# Patient Record
Sex: Female | Born: 1975 | Race: Black or African American | Hispanic: No | Marital: Married | Smoking: Former smoker
Health system: Southern US, Community
[De-identification: ages and names within clinical notes are randomized; demographics above are authoritative.]

## PROBLEM LIST (undated history)

## (undated) DIAGNOSIS — G4730 Sleep apnea, unspecified: Secondary | ICD-10-CM

## (undated) DIAGNOSIS — R42 Dizziness and giddiness: Secondary | ICD-10-CM

## (undated) DIAGNOSIS — O24419 Gestational diabetes mellitus in pregnancy, unspecified control: Secondary | ICD-10-CM

## (undated) HISTORY — PX: CHOLECYSTECTOMY: SHX55

---

## 2004-02-10 ENCOUNTER — Emergency Department: Payer: Self-pay | Admitting: Internal Medicine

## 2004-09-19 ENCOUNTER — Emergency Department: Payer: Self-pay | Admitting: Emergency Medicine

## 2006-03-15 ENCOUNTER — Emergency Department (HOSPITAL_COMMUNITY): Admission: EM | Admit: 2006-03-15 | Discharge: 2006-03-15 | Payer: Self-pay | Admitting: Emergency Medicine

## 2006-08-02 ENCOUNTER — Other Ambulatory Visit: Admission: RE | Admit: 2006-08-02 | Discharge: 2006-08-02 | Payer: Self-pay | Admitting: Obstetrics & Gynecology

## 2008-06-10 ENCOUNTER — Ambulatory Visit: Payer: Self-pay | Admitting: Family Medicine

## 2008-06-10 ENCOUNTER — Inpatient Hospital Stay (HOSPITAL_COMMUNITY): Admission: AD | Admit: 2008-06-10 | Discharge: 2008-06-10 | Payer: Self-pay | Admitting: Obstetrics & Gynecology

## 2010-08-03 LAB — GLUCOSE, CAPILLARY: Glucose-Capillary: 109 mg/dL — ABNORMAL HIGH (ref 70–99)

## 2011-08-27 ENCOUNTER — Encounter (HOSPITAL_COMMUNITY): Payer: Self-pay | Admitting: *Deleted

## 2011-08-27 ENCOUNTER — Emergency Department (HOSPITAL_COMMUNITY)
Admission: EM | Admit: 2011-08-27 | Discharge: 2011-08-27 | Disposition: A | Discharge status: Home / Self Care | Payer: Self-pay | Attending: Emergency Medicine | Admitting: Emergency Medicine

## 2011-08-27 DIAGNOSIS — H00039 Abscess of eyelid unspecified eye, unspecified eyelid: Secondary | ICD-10-CM | POA: Insufficient documentation

## 2011-08-27 DIAGNOSIS — R220 Localized swelling, mass and lump, head: Secondary | ICD-10-CM | POA: Insufficient documentation

## 2011-08-27 MED ORDER — CLINDAMYCIN HCL 150 MG PO CAPS: 450.0000 mg | ORAL_CAPSULE | Freq: Three times a day (TID) | ORAL | Status: AC

## 2011-08-27 MED ORDER — CLINDAMYCIN HCL 150 MG PO CAPS
450.0000 mg | ORAL_CAPSULE | Freq: Once | ORAL | Status: AC
  Administered 2011-08-27: 450 mg via ORAL
  Filled 2011-08-27: qty 1

## 2011-08-27 MED ORDER — HYDROCODONE-ACETAMINOPHEN 5-500 MG PO TABS: 1.0000 | ORAL_TABLET | Freq: Four times a day (QID) | ORAL | Status: AC | PRN

## 2011-08-27 NOTE — ED Notes (Signed)
PA at bedside.

## 2011-08-27 NOTE — ED Provider Notes (Signed)
History     CSN: [id number]  Arrival date & time 08/27/11  1751   First MD Initiated Contact with Patient 08/27/11 1802      Chief Complaint  Patient presents with  . Facial Swelling    Left eye and left cheek    (Consider location/radiation/quality/duration/timing/severity/associated sxs/prior treatment) Patient is a 36 y.o. female presenting with eye pain. The history is provided by the patient.  Eye Pain This is a new problem. The current episode started in the past 7 days. The problem has been gradually worsening. Pertinent negatives include no chills, fever, headaches, nausea, vomiting or weakness. Exacerbated by: touching, blinking. She has tried heat for the symptoms. The treatment provided no relief.  Pt states developed mild swelling to lower eye lid 3 days ago, gradually swelling got worse, now spreading to the face. Tender to touch, Warm, painful. Denies visual changes, denies fever, chills, malaise. No pain with extraocular movement.   History reviewed. No pertinent past medical history.  Past Surgical History  Procedure Date  . Cholecystectomy   . Cesarean section     History reviewed. No pertinent family history.  History  Substance Use Topics  . Smoking status: Current Everyday Smoker    Types: Cigarettes  . Smokeless tobacco: Never Used  . Alcohol Use: Yes     socially    OB History    Grav Para Term Preterm Abortions TAB SAB Ect Mult Living                  Review of Systems  Constitutional: Negative for fever and chills.  Eyes: Positive for pain, discharge and redness. Negative for photophobia and visual disturbance.  Respiratory: Negative.   Cardiovascular: Negative.   Gastrointestinal: Negative for nausea and vomiting.  Musculoskeletal: Negative.   Skin: Positive for color change.  Neurological: Negative for dizziness, weakness and headaches.    Allergies  Codeine  Home Medications   Current Outpatient Rx  Name Route Sig Dispense  Refill  . ADULT MULTIVITAMIN W/MINERALS CH Oral Take 1 tablet by mouth daily.    Marland Kitchen POLYVINYL ALCOHOL 1.4 % OP SOLN Both Eyes Place 1 drop into both eyes as needed. For dry eye relief      BP 149/81  Pulse 98  Temp(Src) 99.4 F (37.4 C) (Oral)  Resp 18  Wt 315 lb (142.883 kg)  SpO2 99%  LMP 08/27/2011  Physical Exam  Nursing note and vitals reviewed. Constitutional: She is oriented to person, place, and time. She appears well-developed and well-nourished. No distress.  HENT:  Head: Normocephalic and atraumatic.  Right Ear: External ear normal.  Left Ear: External ear normal.  Nose: Nose normal.  Mouth/Throat: Oropharynx is clear and moist.  Eyes: Conjunctivae and EOM are normal. Pupils are equal, round, and reactive to light.       Swelling to the left lower eyelid, with redness, extending midway down maxilla. Tender to palpation from left lower eyelid to the left maxilla. No pain with extraocular movement  Neck: Neck supple.  Cardiovascular: Normal rate, regular rhythm and normal heart sounds.   Pulmonary/Chest: Effort normal and breath sounds normal. No respiratory distress. She has no wheezes.  Neurological: She is alert and oriented to person, place, and time.  Skin: Skin is warm and dry.  Psychiatric: She has a normal mood and affect.    ED Course  Procedures (including critical care time)  Exam consistent with periorbital cellulitis. No pain with extraocular movement. Normal VS. Pt thinks this  is allergic reaction to a new cleaning product she used on her pillow 4 days ago, although it is a possibility, due to the amount of tenderness, think this is cellulitis vs allergic inflammation. Will start on antibiotic and benadryl with follow up with eye specialist on Monday. Advised to return if visual changes or if pain with extraocular movement.   1. Periorbital cellulitis, left       MDM         Lottie Mussel, PA 08/28/11 0105

## 2011-08-27 NOTE — ED Notes (Signed)
Pt from home with reports of left eye and cheek swelling that has worsened since Thursday. Pt reports that a cleaning service performed a cleaning demonstration on pt's mattress and pillow on Wednesday and upon awakening Thursday morning left eye began to swell with worsening swelling, redness, numbness and pain since. Pt reports attempting an eye rinse with no relief and denies eye injury.

## 2011-08-27 NOTE — Discharge Instructions (Signed)
Keep warm compresses to the eye. Take clindamycin as prescribed, this medication is cheapest at a [employer]. Take vicodin as prescribed as needed for severe pain, do not drive if taking. Take benadryl, 25mg  every 6 hrs for possible allergic reaction. See an eye doctor on Monday.   Periorbital Cellulitis Periorbital cellulitis is a common infection that can affect the eyelid and the soft tissues that surround the eyeball. The infection may also affect the structures that produce and drain tears. It does not affect the eyeball itself. Natural tissue barriers usually prevent the spread of this infection to the eyeball and other deeper areas of the eye socket.  CAUSES  Bacterial infection.   Long-term (chronic) sinus infections.   An object (foreign body) stuck behind the eye.   An injury that goes through the eyelid tissues.   An injury that causes an infection, such as an insect sting.   Fracture of the bone around the eye.   Infections which have spread from the eyelid or other structures around the eye.   Bite wounds.   Inflammation or infection of the lining membranes of the brain (meningitis).   An infection in the blood (septicemia).   Dental infection (abscess).   Viral infection (this is rare).  SYMPTOMS Symptoms usually come on suddenly.  Pain in the eye.   Red, hot, and swollen eyelids and possibly cheeks. The swelling is sometimes bad enough that the eyelids cannot open. Some infections make the eyelids look purple.   Fever and feeling generally ill.   Pain when touching the area around the eye.  DIAGNOSIS  Periorbital cellulitis can be diagnosed from an eye exam. In severe cases, your caregiver might suggest:  Blood tests.   Imaging tests (such as a CT scan) to examine the sinuses and the area around and behind the eyeball.  TREATMENT If your caregiver feels that you do not have any signs of serious infection, treatment may include:  Antibiotics.     Nasal decongestants to reduce swelling.   Referral to a dentist if it is suspected that the infection was caused by a prior tooth infection.   Examination every day to make sure the problem is improving.  HOME CARE INSTRUCTIONS  Take your antibiotics as directed. Finish them even if you start to feel better.   Some pain is normal with this condition. Take pain medicine as directed by your caregiver. Only take pain medicines approved by your caregiver.   It is important to drink fluids. Drink enough water and fluids to keep your urine clear or pale yellow.   Do not smoke.   Rest and get plenty of sleep.   Mild or moderate fevers generally have no long-term effects and often do not require treatment.   If your caregiver has given you a follow-up appointment, it is very important to keep that appointment. Your caregiver will need to make sure that the infection is getting better. It is important to check that a more serious infection is not developing.  SEEK IMMEDIATE MEDICAL CARE IF:  Your eyelids become more painful, red, warm, or swollen.   You develop double vision or your vision becomes blurred or worsens in any way.   You have trouble moving your eyes.   The eye looks like it is popping out (proptosis).   You develop a severe headache, severe neck pain, or neck stiffness.   You develop repeated vomiting.   You have a fever or persistent symptoms for more  than 72 hours.   You have a fever and your symptoms suddenly get worse.  MAKE SURE YOU:  Understand these instructions.   Will watch your condition.   Will get help right away if you are not doing well or get worse.  Document Released: 05/07/2010 Document Revised: 03/24/2011 Document Reviewed: 05/07/2010 [facility] Patient Information 2012 [place], [place].

## 2011-08-28 NOTE — ED Provider Notes (Signed)
See prior note   Ward Givens, MD 08/28/11 1037

## 2012-01-17 ENCOUNTER — Emergency Department: Payer: Self-pay | Admitting: Unknown Physician Specialty

## 2012-07-28 ENCOUNTER — Emergency Department: Payer: Self-pay | Admitting: Emergency Medicine

## 2012-07-28 LAB — URINALYSIS, COMPLETE
Bilirubin,UR: NEGATIVE
Blood: NEGATIVE
Glucose,UR: NEGATIVE mg/dL (ref 0–75)
Ketone: NEGATIVE
Nitrite: NEGATIVE
Squamous Epithelial: 6
WBC UR: 4 /HPF (ref 0–5)

## 2012-07-28 LAB — WET PREP, GENITAL

## 2012-11-28 ENCOUNTER — Ambulatory Visit: Payer: Self-pay | Admitting: Gastroenterology

## 2013-06-09 ENCOUNTER — Emergency Department: Payer: Self-pay | Admitting: Emergency Medicine

## 2013-09-04 ENCOUNTER — Emergency Department: Payer: Self-pay | Admitting: Emergency Medicine

## 2013-10-18 ENCOUNTER — Emergency Department: Payer: Self-pay | Admitting: Emergency Medicine

## 2014-10-23 ENCOUNTER — Encounter: Payer: Self-pay | Admitting: Urgent Care

## 2014-10-23 ENCOUNTER — Emergency Department
Admission: EM | Admit: 2014-10-23 | Discharge: 2014-10-23 | Discharge status: Against Medical Advice | Payer: Medicaid Other | Attending: Emergency Medicine | Admitting: Emergency Medicine

## 2014-10-23 ENCOUNTER — Emergency Department: Payer: Medicaid Other

## 2014-10-23 DIAGNOSIS — S6992XA Unspecified injury of left wrist, hand and finger(s), initial encounter: Secondary | ICD-10-CM | POA: Insufficient documentation

## 2014-10-23 DIAGNOSIS — Z87891 Personal history of nicotine dependence: Secondary | ICD-10-CM | POA: Insufficient documentation

## 2014-10-23 DIAGNOSIS — Y998 Other external cause status: Secondary | ICD-10-CM | POA: Diagnosis not present

## 2014-10-23 DIAGNOSIS — Y9289 Other specified places as the place of occurrence of the external cause: Secondary | ICD-10-CM | POA: Insufficient documentation

## 2014-10-23 DIAGNOSIS — Y9389 Activity, other specified: Secondary | ICD-10-CM | POA: Insufficient documentation

## 2014-10-23 DIAGNOSIS — W1789XA Other fall from one level to another, initial encounter: Secondary | ICD-10-CM | POA: Insufficient documentation

## 2014-10-23 NOTE — ED Notes (Signed)
Ice pack applied.

## 2014-10-23 NOTE — ED Notes (Signed)
Called pt from the lobby for room assignment, no response  

## 2014-10-23 NOTE — ED Notes (Signed)
Patient presents with c/o LEFT wrist pain s/p falling off porch. (+) swelling with slight deformity noted. (+) PMS noted with cap refill WNL.

## 2014-12-25 ENCOUNTER — Emergency Department
Admission: EM | Admit: 2014-12-25 | Discharge: 2014-12-25 | Discharge status: Against Medical Advice | Payer: Medicaid Other | Attending: Emergency Medicine | Admitting: Emergency Medicine

## 2014-12-25 DIAGNOSIS — R109 Unspecified abdominal pain: Secondary | ICD-10-CM | POA: Diagnosis not present

## 2014-12-25 DIAGNOSIS — R05 Cough: Secondary | ICD-10-CM | POA: Diagnosis not present

## 2014-12-25 DIAGNOSIS — J029 Acute pharyngitis, unspecified: Secondary | ICD-10-CM | POA: Insufficient documentation

## 2014-12-25 DIAGNOSIS — R197 Diarrhea, unspecified: Secondary | ICD-10-CM | POA: Diagnosis not present

## 2014-12-25 DIAGNOSIS — R112 Nausea with vomiting, unspecified: Secondary | ICD-10-CM | POA: Insufficient documentation

## 2014-12-25 DIAGNOSIS — Z87891 Personal history of nicotine dependence: Secondary | ICD-10-CM | POA: Insufficient documentation

## 2014-12-25 LAB — CBC
HEMATOCRIT: 38.6 % (ref 35.0–47.0)
HEMOGLOBIN: 12.5 g/dL (ref 12.0–16.0)
MCH: 26.3 pg (ref 26.0–34.0)
MCHC: 32.4 g/dL (ref 32.0–36.0)
MCV: 81.1 fL (ref 80.0–100.0)
Platelets: 232 10*3/uL (ref 150–440)
RBC: 4.75 MIL/uL (ref 3.80–5.20)
RDW: 15.3 % — ABNORMAL HIGH (ref 11.5–14.5)
WBC: 8.4 10*3/uL (ref 3.6–11.0)

## 2014-12-25 LAB — COMPREHENSIVE METABOLIC PANEL
ALBUMIN: 4.1 g/dL (ref 3.5–5.0)
ALK PHOS: 68 U/L (ref 38–126)
ALT: 13 U/L — ABNORMAL LOW (ref 14–54)
ANION GAP: 8 (ref 5–15)
AST: 19 U/L (ref 15–41)
BUN: 9 mg/dL (ref 6–20)
CALCIUM: 9.2 mg/dL (ref 8.9–10.3)
CHLORIDE: 102 mmol/L (ref 101–111)
CO2: 27 mmol/L (ref 22–32)
Creatinine, Ser: 0.88 mg/dL (ref 0.44–1.00)
GFR calc Af Amer: >60 mL/min (ref >60)
GFR calc non Af Amer: >60 mL/min (ref >60)
GLUCOSE: 90 mg/dL (ref 65–99)
Potassium: 3.6 mmol/L (ref 3.5–5.1)
SODIUM: 137 mmol/L (ref 135–145)
Total Bilirubin: 0.4 mg/dL (ref 0.3–1.2)
Total Protein: 7.9 g/dL (ref 6.5–8.1)

## 2014-12-25 LAB — LIPASE, BLOOD: LIPASE: 14 U/L — AB (ref 22–51)

## 2014-12-25 NOTE — ED Notes (Signed)
38 yof presents to the ED with c/o abdominal pain, nausea, vomiting, diarrhea. Patient states she has vomited 4x and has had 8 diarrheal episodes in the past 24 hours.  Patient denies chest pain, dysuria. Patient does c/o cough and sore throat. Patient denies direct contact with anyone who has been sick. Does state she has been visiting her daughter who is in 1st grade frequently for lunch.

## 2014-12-25 NOTE — ED Notes (Signed)
Pt has been called in the lobby multiple times without response

## 2014-12-26 ENCOUNTER — Encounter: Payer: Self-pay | Admitting: Emergency Medicine

## 2014-12-26 ENCOUNTER — Emergency Department
Admission: EM | Admit: 2014-12-26 | Discharge: 2014-12-26 | Disposition: A | Discharge status: Home / Self Care | Payer: Medicaid Other | Attending: Emergency Medicine | Admitting: Emergency Medicine

## 2014-12-26 DIAGNOSIS — A084 Viral intestinal infection, unspecified: Secondary | ICD-10-CM | POA: Insufficient documentation

## 2014-12-26 DIAGNOSIS — Z79899 Other long term (current) drug therapy: Secondary | ICD-10-CM | POA: Insufficient documentation

## 2014-12-26 DIAGNOSIS — Z87891 Personal history of nicotine dependence: Secondary | ICD-10-CM | POA: Diagnosis not present

## 2014-12-26 DIAGNOSIS — R197 Diarrhea, unspecified: Secondary | ICD-10-CM | POA: Diagnosis present

## 2014-12-26 MED ORDER — ONDANSETRON 4 MG PO TBDP
4.0000 mg | ORAL_TABLET | Freq: Once | ORAL | Status: AC
  Administered 2014-12-26: 4 mg via ORAL
  Filled 2014-12-26: qty 1

## 2014-12-26 MED ORDER — ONDANSETRON 4 MG PO TBDP: 4.0000 mg | ORAL_TABLET | Freq: Three times a day (TID) | ORAL | Status: AC | PRN

## 2014-12-26 NOTE — ED Notes (Signed)
Pt here for n/v/d and body aches x 3 days.

## 2014-12-26 NOTE — ED Provider Notes (Signed)
[address] [facility] Emergency Department Provider Note  ____________________________________________  Time seen: Approximately 9:31 AM  I have reviewed the triage vital signs and the nursing notes.   HISTORY  Chief Complaint Diarrhea   HPI Olivia Lang is a 39 y.o. female is here with complaint of nausea, vomiting, diarrhea and body aches for 3 days. She states in the last 24 hours she has had 4 episodes of vomiting and 8 episodes of diarrhea. She attributes all this to going to her daughter's first grade class and having lunch with her frequently. She denies any sore throat or coughing. She is unaware of any fever. She denies any urinary symptoms or upper respiratory symptoms. She states she has not eaten today. She was able to drive to the hospital today. She states that her pain due to diarrhea is an 8/10.  History reviewed. No pertinent past medical history.  There are no active problems to display for this patient.   Past Surgical History  Procedure Laterality Date  . Cholecystectomy    . Cesarean section      Current Outpatient Rx  Name  Route  Sig  Dispense  Refill  . Multiple Vitamin (MULITIVITAMIN WITH MINERALS) TABS   Oral   Take 1 tablet by mouth daily.         . ondansetron (ZOFRAN ODT) 4 MG disintegrating tablet   Oral   Take 1 tablet (4 mg total) by mouth every 8 (eight) hours as needed for nausea or vomiting.   20 tablet   0   . polyvinyl alcohol (LIQUIFILM TEARS) 1.4 % ophthalmic solution   Both Eyes   Place 1 drop into both eyes as needed. For dry eye relief           Allergies Codeine  History reviewed. No pertinent family history.  Social History Social History  Substance Use Topics  . Smoking status: Former Smoker    Types: Cigarettes  . Smokeless tobacco: Never Used  . Alcohol Use: No     Comment: socially    Review of Systems Constitutional: No fever/chills Eyes: No visual changes. ENT: No sore  throat. Cardiovascular: Denies chest pain. Respiratory: Denies shortness of breath. Gastrointestinal: No abdominal pain.  Positive nausea, positive vomiting.  Positive diarrhea.  No constipation. Genitourinary: Negative for dysuria. Musculoskeletal: Negative for back pain. Skin: Negative for rash. Neurological: Negative for headaches, focal weakness or numbness.  10-point ROS otherwise negative.  ____________________________________________   PHYSICAL EXAM:  VITAL SIGNS: ED Triage Vitals  Enc Vitals Group     BP 12/26/14 0925 147/93 mmHg     Pulse Rate 12/26/14 0925 96     Resp 12/26/14 0925 18     Temp 12/26/14 0925 99 F (37.2 C)     Temp Source 12/26/14 0925 Oral     SpO2 12/26/14 0925 96 %     Weight 12/26/14 0929 345 lb (156.491 kg)     Height 12/26/14 0929 5' (1.524 m)     Head Cir --      Peak Flow --      Pain Score 12/26/14 0925 9     Pain Loc --      Pain Edu? --      Excl. in GC? --     Constitutional: Alert and oriented. Well appearing and in no acute distress. Morbidly obese. Patient is lying on her stomach, sleeping at present. Eyes: Conjunctivae are normal. PERRL. EOMI. Head: Atraumatic. Nose: No congestion/rhinnorhea. Mouth/Throat: Mucous membranes  are moist.  Oropharynx non-erythematous. Neck: No stridor.  Supple Hematological/Lymphatic/Immunilogical: No cervical lymphadenopathy. Cardiovascular: Normal rate, regular rhythm. Grossly normal heart sounds.  Good peripheral circulation. Respiratory: Normal respiratory effort.  No retractions. Lungs CTAB. Gastrointestinal: Soft and nontender. No distention. No abdominal bruits. No CVA tenderness. Bowel sounds 4 quadrants within normal limits. Musculoskeletal: No lower extremity tenderness nor edema.  No joint effusions. Neurologic:  Normal speech and language. No gross focal neurologic deficits are appreciated. No gait instability. Skin:  Skin is warm, dry and intact. No rash noted. Psychiatric: Mood and  affect are normal. Speech and behavior are normal.  ____________________________________________   LABS (all labs ordered are listed, but only abnormal results are displayed)  Labs Reviewed - No data to display   PROCEDURES  Procedure(s) performed: None  Critical Care performed: No  ____________________________________________   INITIAL IMPRESSION / ASSESSMENT AND PLAN / ED COURSE  Pertinent labs & imaging results that were available during my care of the patient were reviewed by me and considered in my medical decision making (see chart for details).  Patient was given Zofran while in the emergency room and was able to tolerate by mouth fluids without any difficulty. Patient was asleep prior to discharge. Patient states she needs a work note for today. She is given a prescription for Zofran and told to remain on clear fluids. She may take Tylenol as needed for muscle aches or headache. She is follow-up with her family doctor if no improvement in 2-3 days. ____________________________________________   FINAL CLINICAL IMPRESSION(S) / ED DIAGNOSES  Final diagnoses:  Viral gastroenteritis      Tommi Rumps, PA-C 12/26/14 1159  Sharyn Creamer, MD 12/26/14 1544

## 2015-04-17 ENCOUNTER — Emergency Department
Admission: EM | Admit: 2015-04-17 | Discharge: 2015-04-17 | Disposition: A | Discharge status: Home / Self Care | Payer: Medicaid Other | Attending: Emergency Medicine | Admitting: Emergency Medicine

## 2015-04-17 ENCOUNTER — Encounter: Payer: Self-pay | Admitting: Emergency Medicine

## 2015-04-17 DIAGNOSIS — Z79899 Other long term (current) drug therapy: Secondary | ICD-10-CM | POA: Diagnosis not present

## 2015-04-17 DIAGNOSIS — Z3202 Encounter for pregnancy test, result negative: Secondary | ICD-10-CM | POA: Insufficient documentation

## 2015-04-17 DIAGNOSIS — Z87891 Personal history of nicotine dependence: Secondary | ICD-10-CM | POA: Diagnosis not present

## 2015-04-17 DIAGNOSIS — N898 Other specified noninflammatory disorders of vagina: Secondary | ICD-10-CM | POA: Diagnosis present

## 2015-04-17 HISTORY — DX: Gestational diabetes mellitus in pregnancy, unspecified control: O24.419

## 2015-04-17 LAB — URINALYSIS COMPLETE WITH MICROSCOPIC (ARMC ONLY)
BILIRUBIN URINE: NEGATIVE
GLUCOSE, UA: NEGATIVE mg/dL
HGB URINE DIPSTICK: NEGATIVE
Ketones, ur: NEGATIVE mg/dL
LEUKOCYTES UA: NEGATIVE
Nitrite: NEGATIVE
PH: 6 (ref 5.0–8.0)
Protein, ur: NEGATIVE mg/dL
RBC / HPF: NONE SEEN RBC/hpf (ref 0–5)
SPECIFIC GRAVITY, URINE: 1.023 (ref 1.005–1.030)

## 2015-04-17 LAB — HCG, QUANTITATIVE, PREGNANCY

## 2015-04-17 LAB — GLUCOSE, CAPILLARY
Glucose-Capillary: 79 mg/dL (ref 65–99)
Glucose-Capillary: 89 mg/dL (ref 65–99)

## 2015-04-17 LAB — POCT PREGNANCY, URINE: Preg Test, Ur: NEGATIVE

## 2015-04-17 NOTE — ED Notes (Signed)
States took a pregnancy test, which was positive.  Patient called PCP who told her to come to ED to confirm pregnancy and to have blood sugar checked because last pregnancy patient had gestational diabetes.  Patient c/o some vaginal itching and moistness.  LMP:  03/24/2015.  G:9  P:1 .  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

## 2015-04-17 NOTE — Discharge Instructions (Signed)
Pregnancy Test Information °WHAT IS A PREGNANCY TEST? °A pregnancy test is used to detect the presence of human chorionic gonadotropin (hCG) in a sample of your urine or blood. hCG is a hormone produced by the cells of the placenta. The placenta is the organ that forms to nourish and support a developing baby. °This test requires a sample of either blood or urine. A pregnancy test determines whether you are pregnant or not. °HOW ARE PREGNANCY TESTS DONE? °Pregnancy tests are done using a home pregnancy test or having a blood or urine test done at your health care provider's office.  °Home pregnancy tests require a urine sample. °· Most kits use a plastic testing device with a strip of paper that indicates whether there is hCG in your urine. °· Follow the test instructions very carefully. °· After you urinate on the test stick, markings will appear to let you know whether you are pregnant. °· For best results, use your first urine of the morning. That is when the concentration of hCG is highest. °Having a blood test to check for pregnancy requires a sample of blood drawn from a vein in your hand or arm. Your health care provider will send your sample to a lab for testing. Results of a pregnancy test will be positive or negative. °IS ONE TYPE OF PREGNANCY TEST BETTER THAN ANOTHER? °In some cases, a blood test will return a positive result even if a urine test was negative because blood tests are more sensitive. This means blood tests can detect hCG earlier than home pregnancy tests.  °HOW ACCURATE ARE HOME PREGNANCY TESTS?  °Both types of pregnancy tests are very accurate. °· A blood test is about 98% accurate. °· When you are far enough along in your pregnancy and when used correctly, home pregnancy tests are equally accurate. °CAN ANYTHING INTERFERE WITH HOME PREGNANCY TEST RESULTS?  °It is possible for certain conditions to cause an inaccurate test result (false positive or false negative). °· A false positive is a  positive test result when you are not pregnant. This can happen if you: °¨ Are taking certain medicines, including anticonvulsants or tranquilizers. °¨ Have certain proteins in your blood. °· A false negative is a negative test result when you are pregnant. This can happen if you: °¨ Took the test before there was enough hCG to detect. A pregnancy test will not be positive in most women until 3-4 weeks after conception. °¨ Drank a lot of liquid before the test. Diluted urine samples can sometimes give an inaccurate result. °¨ Take certain medicines, such as water pills (diuretics) or some antihistamines. °WHAT SHOULD I DO IF I HAVE A POSITIVE PREGNANCY TEST? °If you have a positive pregnancy test, schedule an appointment with your health care provider. You might need additional testing to confirm the pregnancy. In the meantime, begin taking a prenatal vitamin, stop smoking, stop drinking alcohol, and do not use street drugs. °Talk to your health care provider about how to take care of yourself during your pregnancy. Ask about what to expect from the care you will need throughout pregnancy (prenatal care). °  °This information is not intended to replace advice given to you by your health care provider. Make sure you discuss any questions you have with your health care provider. °  °Document Released: 04/07/2003 Document Revised: 04/25/2014 Document Reviewed: 07/30/2013 °Elsevier Interactive Patient Education ©2016 [employer] ° °

## 2015-04-17 NOTE — ED Provider Notes (Signed)
[facility] Emergency Department Provider Note ____________________________________________  Time seen: Approximately 3:54 PM  I have reviewed the triage vital signs and the nursing notes.   HISTORY  Chief Complaint Vaginal Itching   HPI Olivia Lang is a 39 y.o. female who presents to the emergency department for confirmation of pregnancy. Home pregnancy test was positive. She denies other complaints and says that she has an appointment with her doctor on Friday and will address any other concerns at that time. She wants to know if she is pregnant and what her blood sugar is.    Past Medical History  Diagnosis Date  . Gestational diabetes     There are no active problems to display for this patient.   Past Surgical History  Procedure Laterality Date  . Cholecystectomy    . Cesarean section      Current Outpatient Rx  Name  Route  Sig  Dispense  Refill  . Multiple Vitamin (MULITIVITAMIN WITH MINERALS) TABS   Oral   Take 1 tablet by mouth daily.         . ondansetron (ZOFRAN ODT) 4 MG disintegrating tablet   Oral   Take 1 tablet (4 mg total) by mouth every 8 (eight) hours as needed for nausea or vomiting.   20 tablet   0   . polyvinyl alcohol (LIQUIFILM TEARS) 1.4 % ophthalmic solution   Both Eyes   Place 1 drop into both eyes as needed. For dry eye relief           Allergies Codeine  No family history on file.  Social History Social History  Substance Use Topics  . Smoking status: Former Smoker    Types: Cigarettes  . Smokeless tobacco: Never Used  . Alcohol Use: No     Comment: socially    Review of Systems Constitutional: No fever/chills Cardiovascular: Denies chest pain. Respiratory: Denies shortness of breath or cough. Gastrointestinal: Abdominal pain no., nausea no, vomitingno. Genitourinary: Dysuria no, vaginal discharge no.. Musculoskeletal: Negative for back pain. Skin: Negative for rash. Neurological:  Negative for headaches, focal weakness or numbness.  10-point ROS otherwise negative.  ____________________________________________   PHYSICAL EXAM:  VITAL SIGNS: ED Triage Vitals  Enc Vitals Group     BP 04/17/15 1507 131/75 mmHg     Pulse Rate 04/17/15 1507 81     Resp 04/17/15 1507 16     Temp 04/17/15 1507 97.7 F (36.5 C)     Temp Source 04/17/15 1507 Oral     SpO2 04/17/15 1507 99 %     Weight 04/17/15 1507 352 lb (159.666 kg)     Height 04/17/15 1507 5\' 4"  (1.626 m)     Head Cir --      Peak Flow --      Pain Score 04/17/15 1509 0     Pain Loc --      Pain Edu? --      Excl. in GC? --    Constitutional: Alert and oriented. Well appearing and in no acute distress. Eyes: Conjunctivae are normal. PERRL. EOMI. Head: Atraumatic. Nose: No congestion/rhinnorhea. Mouth/Throat: Mucous membranes are moist. Neck: No stridor. Cardiovascular: Good peripheral circulation. Respiratory: Normal respiratory effort.  No retractions. Gastrointestinal: Soft and nontender. No distention. No abdominal bruits. Genitourinary: Pelvic exam: Deferred Musculoskeletal: No extremity tenderness nor edema.  Neurologic:  Normal speech and language. No gross focal neurologic deficits are appreciated. Speech is normal. No gait instability. Skin:  Skin is warm, dry and  intact. No rash noted. Psychiatric: Mood and affect are normal. Speech and behavior are normal.  ____________________________________________   LABS (all labs ordered are listed, but only abnormal results are displayed)  Labs Reviewed  URINALYSIS COMPLETEWITH MICROSCOPIC ([facility] ONLY) - Abnormal; Notable for the following:    Color, Urine YELLOW (*)    APPearance HAZY (*)    Bacteria, UA RARE (*)    Squamous Epithelial / LPF 6-30 (*)    All other components within normal limits  GLUCOSE, CAPILLARY  GLUCOSE, CAPILLARY  HCG, QUANTITATIVE, PREGNANCY  POC URINE PREG, ED  CBG MONITORING, ED  POCT PREGNANCY, URINE    ____________________________________________  RADIOLOGY  Not indicated. ____________________________________________   PROCEDURES  Procedure(s) performed: None  ____________________________________________   INITIAL IMPRESSION / ASSESSMENT AND PLAN / ED COURSE  Pertinent labs & imaging results that were available during my care of the patient were reviewed by me and considered in my medical decision making (see chart for details).  Patient was advised that she is not pregnant. She was advised to follow up with her PCP on Friday as scheduled and return to the ER for symptoms of concern if unable to see PCP. ____________________________________________   FINAL CLINICAL IMPRESSION(S) / ED DIAGNOSES  Final diagnoses:  Pregnancy test negative       Chinita Pester, FNP 04/17/15 1610  Phineas Semen, MD 04/17/15 2211

## 2015-06-26 ENCOUNTER — Emergency Department
Admission: EM | Admit: 2015-06-26 | Discharge: 2015-06-26 | Disposition: A | Discharge status: Home / Self Care | Payer: Medicaid Other | Attending: Emergency Medicine | Admitting: Emergency Medicine

## 2015-06-26 ENCOUNTER — Encounter: Payer: Self-pay | Admitting: Emergency Medicine

## 2015-06-26 DIAGNOSIS — Z87891 Personal history of nicotine dependence: Secondary | ICD-10-CM | POA: Insufficient documentation

## 2015-06-26 DIAGNOSIS — R42 Dizziness and giddiness: Secondary | ICD-10-CM | POA: Diagnosis not present

## 2015-06-26 LAB — COMPREHENSIVE METABOLIC PANEL
ALBUMIN: 4.3 g/dL (ref 3.5–5.0)
ALK PHOS: 55 U/L (ref 38–126)
ALT: 14 U/L (ref 14–54)
ANION GAP: 9 (ref 5–15)
AST: 17 U/L (ref 15–41)
BILIRUBIN TOTAL: 0.5 mg/dL (ref 0.3–1.2)
BUN: 9 mg/dL (ref 6–20)
CALCIUM: 9.3 mg/dL (ref 8.9–10.3)
CO2: 23 mmol/L (ref 22–32)
Chloride: 104 mmol/L (ref 101–111)
Creatinine, Ser: 0.76 mg/dL (ref 0.44–1.00)
Glucose, Bld: 87 mg/dL (ref 65–99)
POTASSIUM: 4.1 mmol/L (ref 3.5–5.1)
Sodium: 136 mmol/L (ref 135–145)
TOTAL PROTEIN: 7.7 g/dL (ref 6.5–8.1)

## 2015-06-26 LAB — URINALYSIS COMPLETE WITH MICROSCOPIC (ARMC ONLY)
BACTERIA UA: NONE SEEN
Bilirubin Urine: NEGATIVE
Glucose, UA: NEGATIVE mg/dL
HGB URINE DIPSTICK: NEGATIVE
LEUKOCYTES UA: NEGATIVE
NITRITE: NEGATIVE
PROTEIN: NEGATIVE mg/dL
RBC / HPF: NONE SEEN RBC/hpf (ref 0–5)
SPECIFIC GRAVITY, URINE: 1.019 (ref 1.005–1.030)
pH: 6 (ref 5.0–8.0)

## 2015-06-26 LAB — CBC
HEMATOCRIT: 37.4 % (ref 35.0–47.0)
HEMOGLOBIN: 12.6 g/dL (ref 12.0–16.0)
MCH: 26.9 pg (ref 26.0–34.0)
MCHC: 33.7 g/dL (ref 32.0–36.0)
MCV: 79.6 fL — ABNORMAL LOW (ref 80.0–100.0)
Platelets: 232 10*3/uL (ref 150–440)
RBC: 4.7 MIL/uL (ref 3.80–5.20)
RDW: 15.1 % — ABNORMAL HIGH (ref 11.5–14.5)
WBC: 10.7 10*3/uL (ref 3.6–11.0)

## 2015-06-26 LAB — POCT PREGNANCY, URINE: PREG TEST UR: NEGATIVE

## 2015-06-26 LAB — LIPASE, BLOOD: Lipase: <10 U/L — ABNORMAL LOW (ref 11–51)

## 2015-06-26 MED ORDER — MECLIZINE HCL 25 MG PO TABS: 25.0000 mg | ORAL_TABLET | Freq: Three times a day (TID) | ORAL | Status: DC | PRN

## 2015-06-26 MED ORDER — ONDANSETRON HCL 4 MG PO TABS: 4.0000 mg | ORAL_TABLET | Freq: Three times a day (TID) | ORAL | Status: DC | PRN

## 2015-06-26 NOTE — ED Notes (Addendum)
Patient reports dizziness x2 days. Reports vomiting today with one episode of diarrhea (x4 in last 3 hours). Denies headache. Patient in no acute distress, ambulatory to triage without difficulty. Patient states she has been taking Phentermine 15mg .

## 2015-06-26 NOTE — ED Notes (Signed)
Patient unable to give urine sample at this time. Sample cup given to patient with instructions.

## 2015-06-26 NOTE — ED Notes (Signed)
Patient updated on wait time. She understands and is just feeling poorly. Explained to her we would be able to get her back to the treatment area within about 10 mins providing nothing major went on.

## 2015-06-26 NOTE — ED Provider Notes (Signed)
[facility] Emergency Department Provider Note    ____________________________________________  Time seen: ~2215  I have reviewed the triage vital signs and the nursing notes.   HISTORY  Chief Complaint Dizziness   History limited by: Not Limited   HPI Olivia Lang is a 40 y.o. female who presents to the emergency department today because of dizziness. The patient states this is been going on for 2 days. It is intermittent. It is worse when she stands up. She describes it as a sense of feeling lightheaded. It has been accompanied by some nausea. The patient states that she did have some vomiting and diarrhea today. She denies any blood in either. The patient denies any headache. No chest pain or palpitations.    Past Medical History  Diagnosis Date  . Gestational diabetes     There are no active problems to display for this patient.   Past Surgical History  Procedure Laterality Date  . Cholecystectomy    . Cesarean section      Current Outpatient Rx  Name  Route  Sig  Dispense  Refill  . meclizine (ANTIVERT) 25 MG tablet   Oral   Take 1 tablet (25 mg total) by mouth 3 (three) times daily as needed for dizziness.   30 tablet   0   . Multiple Vitamin (MULITIVITAMIN WITH MINERALS) TABS   Oral   Take 1 tablet by mouth daily.         . ondansetron (ZOFRAN ODT) 4 MG disintegrating tablet   Oral   Take 1 tablet (4 mg total) by mouth every 8 (eight) hours as needed for nausea or vomiting.   20 tablet   0   . ondansetron (ZOFRAN) 4 MG tablet   Oral   Take 1 tablet (4 mg total) by mouth every 8 (eight) hours as needed.   20 tablet   0   . polyvinyl alcohol (LIQUIFILM TEARS) 1.4 % ophthalmic solution   Both Eyes   Place 1 drop into both eyes as needed. For dry eye relief           Allergies Codeine  No family history on file.  Social History Social History  Substance Use Topics  . Smoking status: Former Smoker     Types: Cigarettes  . Smokeless tobacco: Never Used  . Alcohol Use: No     Comment: socially    Review of Systems  Constitutional: Negative for fever. Cardiovascular: Negative for chest pain. Respiratory: Negative for shortness of breath. Gastrointestinal: Negative for abdominal pain, vomiting and diarrhea. Neurological: Negative for headaches, focal weakness or numbness.  10-point ROS otherwise negative.  ____________________________________________   PHYSICAL EXAM:  VITAL SIGNS: ED Triage Vitals  Enc Vitals Group     BP 06/26/15 1850 121/75 mmHg     Pulse Rate 06/26/15 1850 88     Resp 06/26/15 1850 20     Temp 06/26/15 1850 98 F (36.7 C)     Temp Source 06/26/15 1850 Oral     SpO2 06/26/15 1850 100 %     Weight 06/26/15 1850 350 lb (158.759 kg)     Height 06/26/15 1850  (1.651 m)     Head Cir --      Peak Flow --      Pain Score 06/26/15 1851 0   Constitutional: Alert and oriented. Well appearing and in no distress. Eyes: Conjunctivae are normal. PERRL. Normal extraocular movements.No nystagmus ENT   Head: Normocephalic and atraumatic.  Nose: No congestion/rhinnorhea.   Mouth/Throat: Mucous membranes are moist.   Neck: No stridor. Hematological/Lymphatic/Immunilogical: No cervical lymphadenopathy. Cardiovascular: Normal rate, regular rhythm.  No murmurs, rubs, or gallops. Respiratory: Normal respiratory effort without tachypnea nor retractions. Breath sounds are clear and equal bilaterally. No wheezes/rales/rhonchi. Gastrointestinal: Soft and nontender. No distention. There is no CVA tenderness. Genitourinary: Deferred Musculoskeletal: Normal range of motion in all extremities. No joint effusions.  No lower extremity tenderness nor edema. Neurologic:  Normal speech and language. Cranial nerves intact. Face symmetric. No nystagmus . Strength 5 out of 5 in upper and lower extremities. Sensation grossly intact. No gross focal neurologic deficits are  appreciated.  Skin:  Skin is warm, dry and intact. No rash noted. Psychiatric: Mood and affect are normal. Speech and behavior are normal. Patient exhibits appropriate insight and judgment.  ____________________________________________    LABS (pertinent positives/negatives)  Labs Reviewed  LIPASE, BLOOD - Abnormal; Notable for the following:    Lipase <10 (*)    All other components within normal limits  CBC - Abnormal; Notable for the following:    MCV 79.6 (*)    RDW 15.1 (*)    All other components within normal limits  URINALYSIS COMPLETEWITH MICROSCOPIC ([facility] ONLY) - Abnormal; Notable for the following:    Color, Urine YELLOW (*)    APPearance CLEAR (*)    Ketones, ur 1+ (*)    Squamous Epithelial / LPF 0-5 (*)    All other components within normal limits  COMPREHENSIVE METABOLIC PANEL  POC URINE PREG, ED  POCT PREGNANCY, URINE     ____________________________________________   EKG  None  ____________________________________________    RADIOLOGY  None   ____________________________________________   PROCEDURES  Procedure(s) performed: None  Critical Care performed: No  ____________________________________________   INITIAL IMPRESSION / ASSESSMENT AND PLAN / ED COURSE  Pertinent labs & imaging results that were available during my care of the patient were reviewed by me and considered in my medical decision making (see chart for details).  Patient presents to the emergency department today because of concerns for dizziness, nausea and vomiting. On exam patient appears well. No focal neuro deficits. This point I do not think patient suffered from a central lesion given the intermittent nature of the episodes. Think more likely patient suffering some vertigo versus BPPV. Will plan on giving patient meclizine and Zofran.   ____________________________________________   FINAL CLINICAL IMPRESSION(S) / ED DIAGNOSES  Final diagnoses:  Dizziness      Phineas SemenGraydon Tsuneo Faison, MD 06/26/15 [phone]

## 2015-06-26 NOTE — Discharge Instructions (Signed)
Please seek medical attention for any high fevers, chest pain, shortness of breath, change in behavior, persistent vomiting, bloody stool or any other new or concerning symptoms. ° ° °Dizziness °Dizziness is a common problem. It makes you feel unsteady or lightheaded. You may feel like you are about to pass out (faint). Dizziness can lead to injury if you stumble or fall. Anyone can get dizzy, but dizziness is more common in older adults. This condition can be caused by a number of things, including: °· Medicines. °· Dehydration. °· Illness. °HOME CARE °Following these instructions may help with your condition: °Eating and Drinking °· Drink enough fluid to keep your pee (urine) clear or pale yellow. This helps to keep you from getting dehydrated. Try to drink more clear fluids, such as water. °· Do not drink alcohol. °· Limit how much caffeine you drink or eat if told by your doctor. °· Limit how much salt you drink or eat if told by your doctor. °Activity °· Avoid making quick movements. °¨ When you stand up from sitting in a chair, steady yourself until you feel okay. °¨ In the morning, first sit up on the side of the bed. When you feel okay, stand slowly while you hold onto something. Do this until you know that your balance is fine. °· Move your legs often if you need to stand in one place for a long time. Tighten and relax your muscles in your legs while you are standing. °· Do not drive or use heavy machinery if you feel dizzy. °· Avoid bending down if you feel dizzy. Place items in your home so that they are easy for you to reach without leaning over. °Lifestyle °· Do not use any tobacco products, including cigarettes, chewing tobacco, or electronic cigarettes. If you need help quitting, ask your doctor. °· Try to lower your stress level, such as with yoga or meditation. Talk with your doctor if you need help. °General Instructions °· Watch your dizziness for any changes. °· Take medicines only as told by  your doctor. Talk with your doctor if you think that your dizziness is caused by a medicine that you are taking. °· Tell a friend or a family member that you are feeling dizzy. If he or she notices any changes in your behavior, have this person call your doctor. °· Keep all follow-up visits as told by your doctor. This is important. °GET HELP IF: °· Your dizziness does not go away. °· Your dizziness or light-headedness gets worse. °· You feel sick to your stomach (nauseous). °· You have trouble hearing. °· You have new symptoms. °· You are unsteady on your feet or you feel like the room is spinning. °GET HELP RIGHT AWAY IF: °· You throw up (vomit) or have diarrhea and are unable to eat or drink anything. °· You have trouble: °¨ Talking. °¨ Walking. °¨ Swallowing. °¨ Using your arms, hands, or legs. °· You feel generally weak. °· You are not thinking clearly or you have trouble forming sentences. It may take a friend or family member to notice this. °· You have: °¨ Chest pain. °¨ Pain in your belly (abdomen). °¨ Shortness of breath. °¨ Sweating. °· Your vision changes. °· You are bleeding. °· You have a headache. °· You have neck pain or a stiff neck. °· You have a fever. °  °This information is not intended to replace advice given to you by your health care provider. Make sure you discuss any questions you   have with your health care provider. °  °Document Released: 03/24/2011 Document Revised: 08/19/2014 Document Reviewed: 03/31/2014 °Elsevier Interactive Patient Education ©2016 [employer] ° °

## 2015-06-26 NOTE — ED Notes (Signed)
Patient denies current nausea, no Zofran given in triage.

## 2016-03-31 ENCOUNTER — Encounter: Payer: Self-pay | Admitting: Emergency Medicine

## 2016-03-31 ENCOUNTER — Emergency Department
Admission: EM | Admit: 2016-03-31 | Discharge: 2016-03-31 | Disposition: A | Discharge status: Home / Self Care | Payer: Medicaid Other | Attending: Emergency Medicine | Admitting: Emergency Medicine

## 2016-03-31 ENCOUNTER — Emergency Department: Payer: Medicaid Other

## 2016-03-31 DIAGNOSIS — Z79899 Other long term (current) drug therapy: Secondary | ICD-10-CM | POA: Diagnosis not present

## 2016-03-31 DIAGNOSIS — N760 Acute vaginitis: Secondary | ICD-10-CM | POA: Insufficient documentation

## 2016-03-31 DIAGNOSIS — B9689 Other specified bacterial agents as the cause of diseases classified elsewhere: Secondary | ICD-10-CM

## 2016-03-31 DIAGNOSIS — N3000 Acute cystitis without hematuria: Secondary | ICD-10-CM | POA: Diagnosis not present

## 2016-03-31 DIAGNOSIS — N898 Other specified noninflammatory disorders of vagina: Secondary | ICD-10-CM | POA: Diagnosis present

## 2016-03-31 DIAGNOSIS — R102 Pelvic and perineal pain: Secondary | ICD-10-CM

## 2016-03-31 DIAGNOSIS — Z87891 Personal history of nicotine dependence: Secondary | ICD-10-CM | POA: Diagnosis not present

## 2016-03-31 HISTORY — DX: Dizziness and giddiness: R42

## 2016-03-31 LAB — COMPREHENSIVE METABOLIC PANEL
ALBUMIN: 4.1 g/dL (ref 3.5–5.0)
ALT: 12 U/L — ABNORMAL LOW (ref 14–54)
ANION GAP: 7 (ref 5–15)
AST: 15 U/L (ref 15–41)
Alkaline Phosphatase: 56 U/L (ref 38–126)
BILIRUBIN TOTAL: 0.5 mg/dL (ref 0.3–1.2)
BUN: 12 mg/dL (ref 6–20)
CO2: 26 mmol/L (ref 22–32)
Calcium: 8.9 mg/dL (ref 8.9–10.3)
Chloride: 106 mmol/L (ref 101–111)
Creatinine, Ser: 0.96 mg/dL (ref 0.44–1.00)
GFR calc Af Amer: >60 mL/min (ref >60)
GLUCOSE: 98 mg/dL (ref 65–99)
Potassium: 3.8 mmol/L (ref 3.5–5.1)
Sodium: 139 mmol/L (ref 135–145)
TOTAL PROTEIN: 7.6 g/dL (ref 6.5–8.1)

## 2016-03-31 LAB — CHLAMYDIA/NGC RT PCR (ARMC ONLY)
CHLAMYDIA TR: NOT DETECTED
N GONORRHOEAE: NOT DETECTED

## 2016-03-31 LAB — WET PREP, GENITAL
SPERM: NONE SEEN
Trich, Wet Prep: NONE SEEN
Yeast Wet Prep HPF POC: NONE SEEN

## 2016-03-31 LAB — URINALYSIS, COMPLETE (UACMP) WITH MICROSCOPIC
BILIRUBIN URINE: NEGATIVE
GLUCOSE, UA: NEGATIVE mg/dL
Hgb urine dipstick: NEGATIVE
Ketones, ur: NEGATIVE mg/dL
Leukocytes, UA: NEGATIVE
NITRITE: NEGATIVE
PH: 5.5 (ref 5.0–8.0)
Protein, ur: NEGATIVE mg/dL

## 2016-03-31 LAB — POCT PREGNANCY, URINE: Preg Test, Ur: NEGATIVE

## 2016-03-31 LAB — CBC
HCT: 37.1 % (ref 35.0–47.0)
Hemoglobin: 12.5 g/dL (ref 12.0–16.0)
MCH: 26.5 pg (ref 26.0–34.0)
MCHC: 33.6 g/dL (ref 32.0–36.0)
MCV: 79 fL — ABNORMAL LOW (ref 80.0–100.0)
Platelets: 309 10*3/uL (ref 150–440)
RBC: 4.7 MIL/uL (ref 3.80–5.20)
RDW: 15.2 % — AB (ref 11.5–14.5)
WBC: 9.5 10*3/uL (ref 3.6–11.0)

## 2016-03-31 LAB — LIPASE, BLOOD: Lipase: 11 U/L (ref 11–51)

## 2016-03-31 MED ORDER — CEFTRIAXONE SODIUM-DEXTROSE 1-3.74 GM-% IV SOLR
1.0000 g | Freq: Once | INTRAVENOUS | Status: DC
  Filled 2016-03-31: qty 50

## 2016-03-31 MED ORDER — AZITHROMYCIN 500 MG PO TABS
1000.0000 mg | ORAL_TABLET | Freq: Once | ORAL | Status: AC
  Administered 2016-03-31: 1000 mg via ORAL
  Filled 2016-03-31: qty 2

## 2016-03-31 MED ORDER — DEXTROSE 5 % IV SOLN: 1.0000 g | Freq: Once | INTRAVENOUS | Status: DC

## 2016-03-31 MED ORDER — SODIUM CHLORIDE 0.9 % IV SOLN
Freq: Once | INTRAVENOUS | Status: AC
  Administered 2016-03-31: 12:00:00 via INTRAVENOUS

## 2016-03-31 MED ORDER — METOCLOPRAMIDE HCL 5 MG/ML IJ SOLN
10.0000 mg | Freq: Once | INTRAMUSCULAR | Status: AC
  Administered 2016-03-31: 10 mg via INTRAVENOUS
  Filled 2016-03-31: qty 2

## 2016-03-31 MED ORDER — NITROFURANTOIN MONOHYD MACRO 100 MG PO CAPS: 100.0000 mg | ORAL_CAPSULE | Freq: Two times a day (BID) | ORAL | 0 refills | Status: DC

## 2016-03-31 MED ORDER — METRONIDAZOLE 500 MG PO TABS: 500.0000 mg | ORAL_TABLET | Freq: Two times a day (BID) | ORAL | 0 refills | Status: DC

## 2016-03-31 MED ORDER — KETOROLAC TROMETHAMINE 30 MG/ML IJ SOLN
30.0000 mg | Freq: Once | INTRAMUSCULAR | Status: AC
  Administered 2016-03-31: 30 mg via INTRAVENOUS
  Filled 2016-03-31: qty 1

## 2016-03-31 NOTE — ED Triage Notes (Signed)
Pt presents to ED with c/o RLQ abdominal pain, urinary frequency with malodorous urine,  back pain and HA. Pt states hx of vertigo and states yesterday she "was unable to see" because of the vertigo. Pt is alert and oriented and neurologically intact at this time.

## 2016-03-31 NOTE — ED Provider Notes (Signed)
[facility] Emergency Department Provider Note        Time seen: ----------------------------------------- 11:09 AM on 03/31/2016 -----------------------------------------    I have reviewed the triage vital signs and the nursing notes.   HISTORY  Chief Complaint Abdominal Pain; Headache; and Urinary Frequency    HPI Olivia Lang is a 40 y.o. female who presents to ER with right lower quadrant abdominal pain, urinary frequency and malodorous urine. She's also had back pain and headache.  She noticed some white vaginal discharge but patient denies any other complaints at this time. She states she did throw up yesterday. This has not been a frequent problem for her.   Past Medical History:  Diagnosis Date  . Gestational diabetes   . Vertigo     There are no active problems to display for this patient.   Past Surgical History:  Procedure Laterality Date  . CESAREAN SECTION    . CHOLECYSTECTOMY      Allergies Codeine  Social History Social History  Substance Use Topics  . Smoking status: Former Smoker    Types: Cigarettes  . Smokeless tobacco: Never Used  . Alcohol use No     Comment: socially    Review of Systems Constitutional: Negative for fever. Cardiovascular: Negative for chest pain. Respiratory: Negative for shortness of breath. Gastrointestinal: Positive for abdominal pain Genitourinary: Positive for dysuria Musculoskeletal: Positive for back pain Skin: Negative for rash. Neurological: Positive for headache  10-point ROS otherwise negative.  ____________________________________________   PHYSICAL EXAM:  VITAL SIGNS: ED Triage Vitals  Enc Vitals Group     BP 03/31/16 1005 (!) 130/56     Pulse Rate 03/31/16 1005 85     Resp 03/31/16 1005 16     Temp 03/31/16 1005 98.6 F (37 C)     Temp Source 03/31/16 1005 Oral     SpO2 03/31/16 1005 98 %     Weight 03/31/16 1005 (!) 320 lb (145.2 kg)     Height 03/31/16  1005 5\' 6"  (1.676 m)     Head Circumference --      Peak Flow --      Pain Score 03/31/16 1008 10     Pain Loc --      Pain Edu? --      Excl. in GC? --    Constitutional: Alert and oriented. Well appearing and in no distress. Eyes: Conjunctivae are normal. PERRL. Normal extraocular movements. ENT   Head: Normocephalic and atraumatic.   Nose: No congestion/rhinnorhea.   Mouth/Throat: Mucous membranes are moist.   Neck: No stridor. Cardiovascular: Normal rate, regular rhythm. No murmurs, rubs, or gallops. Respiratory: Normal respiratory effort without tachypnea nor retractions. Breath sounds are clear and equal bilaterally. No wheezes/rales/rhonchi. Gastrointestinal: Soft and nontender. Normal bowel sounds Genitourinary: White vaginal discharge, cervical motion tenderness Musculoskeletal: Nontender with normal range of motion in all extremities. No lower extremity tenderness nor edema. Neurologic:  Normal speech and language. No gross focal neurologic deficits are appreciated.  Skin:  Skin is warm, dry and intact. No rash noted. Psychiatric: Mood and affect are normal. Speech and behavior are normal.  ____________________________________________  ED COURSE:  Pertinent labs & imaging results that were available during my care of the patient were reviewed by me and considered in my medical decision making (see chart for details). Clinical Course   Patient is in no distress, we will assess with labs and imaging if needed  Procedures ____________________________________________   LABS (pertinent positives/negatives)  Labs  Reviewed  WET PREP, GENITAL - Abnormal; Notable for the following:       Result Value   Clue Cells Wet Prep HPF POC PRESENT (*)    WBC, Wet Prep HPF POC FEW (*)    All other components within normal limits  COMPREHENSIVE METABOLIC PANEL - Abnormal; Notable for the following:    ALT 12 (*)    All other components within normal limits  CBC -  Abnormal; Notable for the following:    MCV 79.0 (*)    RDW 15.2 (*)    All other components within normal limits  URINALYSIS, COMPLETE (UACMP) WITH MICROSCOPIC - Abnormal; Notable for the following:    Specific Gravity, Urine >1.030 (*)    Squamous Epithelial / LPF 6-30 (*)    Bacteria, UA FEW (*)    All other components within normal limits  CHLAMYDIA/NGC RT PCR ([facility] ONLY)  LIPASE, BLOOD  POC URINE PREG, ED  POCT PREGNANCY, URINE  ___________________________________________  IMPRESSION: No acute pelvic abnormality is observed.  FINAL ASSESSMENT AND PLAN  Cystitis, Bacterial vaginosis, pelvic pain  Plan: Patient with labs and imaging as dictated above. Patient with pelvic pain of uncertain etiology. 2. Dehydrated, was given IV fluids and headache cocktail. She also was given Rocephin and Zithromax due to her pelvic examination. Gonorrhea and chlamydia testing were negative. She will be discharged with antibiotics for symptoms of UTI and Flagyl for bacterial vaginosis. She is stable for outpatient follow-up with her doctor.   Olivia FilbertWilliams, Jonathan E, MD   Note: This dictation was prepared with Dragon dictation. Any transcriptional errors that result from this process are unintentional    Olivia FilbertJonathan E Williams, MD 03/31/16 1441

## 2016-04-19 ENCOUNTER — Ambulatory Visit: Payer: Medicaid Other | Attending: Otolaryngology

## 2016-04-19 DIAGNOSIS — G4733 Obstructive sleep apnea (adult) (pediatric): Secondary | ICD-10-CM | POA: Diagnosis not present

## 2016-04-19 DIAGNOSIS — F5101 Primary insomnia: Secondary | ICD-10-CM | POA: Diagnosis present

## 2016-10-06 ENCOUNTER — Emergency Department: Payer: Medicaid Other

## 2016-10-06 ENCOUNTER — Emergency Department
Admission: EM | Admit: 2016-10-06 | Discharge: 2016-10-07 | Disposition: A | Discharge status: Home / Self Care | Payer: Medicaid Other | Attending: Emergency Medicine | Admitting: Emergency Medicine

## 2016-10-06 ENCOUNTER — Encounter: Payer: Self-pay | Admitting: Emergency Medicine

## 2016-10-06 DIAGNOSIS — Y929 Unspecified place or not applicable: Secondary | ICD-10-CM | POA: Insufficient documentation

## 2016-10-06 DIAGNOSIS — Y939 Activity, unspecified: Secondary | ICD-10-CM | POA: Diagnosis not present

## 2016-10-06 DIAGNOSIS — S5002XA Contusion of left elbow, initial encounter: Secondary | ICD-10-CM

## 2016-10-06 DIAGNOSIS — Y999 Unspecified external cause status: Secondary | ICD-10-CM | POA: Insufficient documentation

## 2016-10-06 DIAGNOSIS — S59902A Unspecified injury of left elbow, initial encounter: Secondary | ICD-10-CM | POA: Diagnosis present
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
## 2016-10-07 ENCOUNTER — Emergency Department: Payer: Medicaid Other

## 2016-10-07 MED ORDER — OXYCODONE-ACETAMINOPHEN 5-325 MG PO TABS
1.0000 | ORAL_TABLET | Freq: Once | ORAL | Status: AC
  Administered 2016-10-07: 1 via ORAL
  Filled 2016-10-07: qty 1

## 2016-10-07 MED ORDER — BACITRACIN ZINC 500 UNIT/GM EX OINT
TOPICAL_OINTMENT | Freq: Once | CUTANEOUS | Status: AC
  Administered 2016-10-07: 1 via TOPICAL

## 2016-10-07 MED ORDER — BACITRACIN ZINC 500 UNIT/GM EX OINT
TOPICAL_OINTMENT | CUTANEOUS | Status: AC
  Filled 2016-10-07: qty 0.9

## 2016-10-07 MED ORDER — OXYCODONE-ACETAMINOPHEN 5-325 MG PO TABS: 1.0000 | ORAL_TABLET | ORAL | 0 refills | Status: AC | PRN
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
## 2017-02-01 ENCOUNTER — Ambulatory Visit: Payer: Medicaid Other | Attending: Otolaryngology

## 2017-02-01 DIAGNOSIS — G4733 Obstructive sleep apnea (adult) (pediatric): Secondary | ICD-10-CM | POA: Diagnosis not present

## 2017-02-01 DIAGNOSIS — F5101 Primary insomnia: Secondary | ICD-10-CM | POA: Insufficient documentation

## 2017-04-21 ENCOUNTER — Other Ambulatory Visit: Payer: Self-pay | Admitting: Obstetrics and Gynecology

## 2017-04-21 DIAGNOSIS — Z1231 Encounter for screening mammogram for malignant neoplasm of breast: Secondary | ICD-10-CM

## 2017-05-04 ENCOUNTER — Ambulatory Visit (HOSPITAL_COMMUNITY): Payer: Medicaid Other

## 2017-05-11 ENCOUNTER — Other Ambulatory Visit: Payer: Self-pay

## 2017-05-11 ENCOUNTER — Encounter: Payer: Self-pay | Admitting: Emergency Medicine

## 2017-05-11 ENCOUNTER — Emergency Department: Payer: Medicaid Other

## 2017-05-11 ENCOUNTER — Inpatient Hospital Stay
Admission: EM | Admit: 2017-05-11 | Discharge: 2017-05-13 | DRG: 153 | Disposition: A | Discharge status: Home / Self Care | Payer: Medicaid Other | Attending: Internal Medicine | Admitting: Internal Medicine

## 2017-05-11 DIAGNOSIS — H5500 Unspecified nystagmus: Secondary | ICD-10-CM | POA: Diagnosis present

## 2017-05-11 DIAGNOSIS — G4733 Obstructive sleep apnea (adult) (pediatric): Secondary | ICD-10-CM | POA: Diagnosis present

## 2017-05-11 DIAGNOSIS — J069 Acute upper respiratory infection, unspecified: Secondary | ICD-10-CM | POA: Diagnosis present

## 2017-05-11 DIAGNOSIS — Z87891 Personal history of nicotine dependence: Secondary | ICD-10-CM

## 2017-05-11 DIAGNOSIS — R42 Dizziness and giddiness: Secondary | ICD-10-CM | POA: Diagnosis present

## 2017-05-11 DIAGNOSIS — H53143 Visual discomfort, bilateral: Secondary | ICD-10-CM | POA: Diagnosis present

## 2017-05-11 DIAGNOSIS — H8190 Unspecified disorder of vestibular function, unspecified ear: Secondary | ICD-10-CM

## 2017-05-11 DIAGNOSIS — H5509 Other forms of nystagmus: Secondary | ICD-10-CM

## 2017-05-11 HISTORY — DX: Sleep apnea, unspecified: G47.30

## 2017-05-11 LAB — URINALYSIS, COMPLETE (UACMP) WITH MICROSCOPIC
BILIRUBIN URINE: NEGATIVE
Bacteria, UA: NONE SEEN
Glucose, UA: NEGATIVE mg/dL
Hgb urine dipstick: NEGATIVE
Ketones, ur: NEGATIVE mg/dL
LEUKOCYTES UA: NEGATIVE
Nitrite: NEGATIVE
PH: 7 (ref 5.0–8.0)
Protein, ur: NEGATIVE mg/dL
SPECIFIC GRAVITY, URINE: 1.015 (ref 1.005–1.030)

## 2017-05-11 LAB — CBC
HCT: 37.5 % (ref 35.0–47.0)
Hemoglobin: 12.5 g/dL (ref 12.0–16.0)
MCH: 26.8 pg (ref 26.0–34.0)
MCHC: 33.2 g/dL (ref 32.0–36.0)
MCV: 80.5 fL (ref 80.0–100.0)
PLATELETS: 267 10*3/uL (ref 150–440)
RBC: 4.66 MIL/uL (ref 3.80–5.20)
RDW: 15.6 % — AB (ref 11.5–14.5)
WBC: 8.5 10*3/uL (ref 3.6–11.0)

## 2017-05-11 LAB — BASIC METABOLIC PANEL
Anion gap: 9 (ref 5–15)
BUN: 9 mg/dL (ref 6–20)
CO2: 26 mmol/L (ref 22–32)
CREATININE: 0.86 mg/dL (ref 0.44–1.00)
Calcium: 9 mg/dL (ref 8.9–10.3)
Chloride: 102 mmol/L (ref 101–111)
GFR calc Af Amer: >60 mL/min (ref >60)
GLUCOSE: 110 mg/dL — AB (ref 65–99)
Potassium: 4.5 mmol/L (ref 3.5–5.1)
SODIUM: 137 mmol/L (ref 135–145)

## 2017-05-11 MED ORDER — ONDANSETRON HCL 4 MG PO TABS: 4.0000 mg | ORAL_TABLET | Freq: Four times a day (QID) | ORAL | Status: DC | PRN

## 2017-05-11 MED ORDER — ONDANSETRON HCL 4 MG/2ML IJ SOLN
4.0000 mg | Freq: Once | INTRAMUSCULAR | Status: AC
  Administered 2017-05-11: 4 mg via INTRAVENOUS
  Filled 2017-05-11: qty 2

## 2017-05-11 MED ORDER — SODIUM CHLORIDE 0.9 % IV BOLUS (SEPSIS)
1000.0000 mL | Freq: Once | INTRAVENOUS | Status: AC
  Administered 2017-05-11: 1000 mL via INTRAVENOUS

## 2017-05-11 MED ORDER — MECLIZINE HCL 25 MG PO TABS
25.0000 mg | ORAL_TABLET | Freq: Three times a day (TID) | ORAL | Status: DC | PRN
  Administered 2017-05-13: 10:00:00 25 mg via ORAL
  Filled 2017-05-11 (×2): qty 1

## 2017-05-11 MED ORDER — SODIUM CHLORIDE 0.9 % IV SOLN
INTRAVENOUS | Status: DC
  Administered 2017-05-11 – 2017-05-13 (×4): via INTRAVENOUS

## 2017-05-11 MED ORDER — MECLIZINE HCL 25 MG PO TABS
25.0000 mg | ORAL_TABLET | Freq: Once | ORAL | Status: AC
  Administered 2017-05-11: 25 mg via ORAL
  Filled 2017-05-11: qty 1

## 2017-05-11 MED ORDER — DIAZEPAM 2 MG PO TABS
2.0000 mg | ORAL_TABLET | Freq: Four times a day (QID) | ORAL | Status: DC | PRN
  Administered 2017-05-12 – 2017-05-13 (×2): 2 mg via ORAL
  Filled 2017-05-11 (×2): qty 1

## 2017-05-11 MED ORDER — LORAZEPAM 2 MG/ML IJ SOLN
2.0000 mg | Freq: Once | INTRAMUSCULAR | Status: AC
  Administered 2017-05-11: 2 mg via INTRAVENOUS
  Filled 2017-05-11: qty 1

## 2017-05-11 MED ORDER — LORATADINE 10 MG PO TABS
10.0000 mg | ORAL_TABLET | Freq: Every day | ORAL | Status: DC
  Administered 2017-05-12 – 2017-05-13 (×2): 10 mg via ORAL
  Filled 2017-05-11 (×2): qty 1

## 2017-05-11 MED ORDER — ENOXAPARIN SODIUM 40 MG/0.4ML ~~LOC~~ SOLN
40.0000 mg | Freq: Two times a day (BID) | SUBCUTANEOUS | Status: DC
  Administered 2017-05-11 – 2017-05-13 (×2): 40 mg via SUBCUTANEOUS
  Filled 2017-05-11 (×4): qty 0.4

## 2017-05-11 MED ORDER — ACETAMINOPHEN 650 MG RE SUPP: 650.0000 mg | Freq: Four times a day (QID) | RECTAL | Status: DC | PRN

## 2017-05-11 MED ORDER — PREDNISONE 20 MG PO TABS
40.0000 mg | ORAL_TABLET | Freq: Once | ORAL | Status: AC
  Administered 2017-05-11: 40 mg via ORAL
  Filled 2017-05-11: qty 2

## 2017-05-11 MED ORDER — POLYETHYLENE GLYCOL 3350 17 G PO PACK: 17.0000 g | PACK | Freq: Every day | ORAL | Status: DC | PRN

## 2017-05-11 MED ORDER — ONDANSETRON HCL 4 MG/2ML IJ SOLN
4.0000 mg | Freq: Four times a day (QID) | INTRAMUSCULAR | Status: DC | PRN
  Administered 2017-05-12: 09:00:00 4 mg via INTRAVENOUS
  Filled 2017-05-11: qty 2

## 2017-05-11 MED ORDER — ACETAMINOPHEN 325 MG PO TABS
650.0000 mg | ORAL_TABLET | Freq: Four times a day (QID) | ORAL | Status: DC | PRN
  Administered 2017-05-12 (×2): 650 mg via ORAL
  Filled 2017-05-11 (×2): qty 2

## 2017-05-11 MED ORDER — BISACODYL 10 MG RE SUPP: 10.0000 mg | Freq: Every day | RECTAL | Status: DC | PRN

## 2017-05-11 MED ORDER — KETOROLAC TROMETHAMINE 30 MG/ML IJ SOLN
30.0000 mg | Freq: Once | INTRAMUSCULAR | Status: AC
  Administered 2017-05-11: 30 mg via INTRAVENOUS
  Filled 2017-05-11: qty 1

## 2017-05-11 MED ORDER — DIAZEPAM 5 MG/ML IJ SOLN: 5.0000 mg | Freq: Once | INTRAMUSCULAR | Status: DC

## 2017-05-11 MED ORDER — DIAZEPAM 5 MG PO TABS
5.0000 mg | ORAL_TABLET | Freq: Once | ORAL | Status: AC
  Administered 2017-05-11: 5 mg via ORAL
  Filled 2017-05-11: qty 1

## 2017-05-11 NOTE — ED Triage Notes (Signed)
Pt in via [facility] from home with complaints of dizziness which began yesterday.  Pt with hx of vertigo, taking meclizine yesterday without any relief and since running out of medication.  Pt reports dizziness is worse today with some N/V, also reports photophobia.  Pt states, "I have been here before for the same, they had to give me some cocktail because the meclizine wasn't working."  Vitals WDL.  NAD noted at this time.

## 2017-05-11 NOTE — H&P (Signed)
[facility] [place] [facility]   PATIENT NAME: Olivia Lang    MR#:  [id number]  DATE OF BIRTH:  14-Feb-1976  DATE OF ADMISSION:  05/11/2017  PRIMARY CARE PHYSICIAN: Verner Mould, MD   REQUESTING/REFERRING PHYSICIAN: dr Sharma Covert  CHIEF COMPLAINT:   Dizziness and nausea HISTORY OF PRESENT ILLNESS:  Olivia Lang  is a 42 y.o. female with a known history of vertigo who presents with above complaint. Patient presently over the past several days she has had vertigo. She also noticed that her eyes are auscultating. She had a upper respiratory infection over the past few days. She felt like she had a sinus headache this morning. She denies focal neurological deficits. She has also had nausea and vomiting associated with the dizziness. She tried meclizine however it did not help.  Patient has been given Valium and meclizine along with IV fluids in the emergency room but continues to have these symptoms. ED physician also spoke with Dr. Thad Ranger regarding the vertigo.  PAST MEDICAL HISTORY:   Past Medical History:  Diagnosis Date  . Gestational diabetes   . Sleep apnea   . Vertigo     PAST SURGICAL HISTORY:   Past Surgical History:  Procedure Laterality Date  . CESAREAN SECTION    . CHOLECYSTECTOMY      SOCIAL HISTORY:   Social History   Tobacco Use  . Smoking status: Former Smoker    Types: Cigarettes  . Smokeless tobacco: Never Used  Substance Use Topics  . Alcohol use: No    Comment: socially    FAMILY HISTORY:  No family history on file.  DRUG ALLERGIES:   Allergies  Allergen Reactions  . Codeine Palpitations    REVIEW OF SYSTEMS:   Review of Systems  Constitutional: Positive for malaise/fatigue. Negative for chills and fever.  HENT: Negative.  Negative for ear discharge, ear pain, hearing loss, nosebleeds and sore throat.   Eyes: Negative.  Negative for blurred vision and pain.  Respiratory: Negative.  Negative for cough,  hemoptysis, shortness of breath and wheezing.   Cardiovascular: Negative.  Negative for chest pain, palpitations and leg swelling.  Gastrointestinal: Negative.  Negative for abdominal pain, blood in stool, diarrhea, nausea and vomiting.  Genitourinary: Negative.  Negative for dysuria.  Musculoskeletal: Negative.  Negative for back pain.  Skin: Negative.   Neurological: Positive for dizziness, weakness and headaches. Negative for tremors, speech change, focal weakness and seizures.  Endo/Heme/Allergies: Negative.  Does not bruise/bleed easily.  Psychiatric/Behavioral: Negative.  Negative for depression, hallucinations and suicidal ideas.    MEDICATIONS AT HOME:   Prior to Admission medications   Medication Sig Start Date End Date Taking? Authorizing Provider  meclizine (ANTIVERT) 25 MG tablet Take 1 tablet (25 mg total) by mouth 3 (three) times daily as needed for dizziness. 06/26/15  Yes Phineas Semen, MD  Azelastine HCl (ASTEPRO NA) Place 1 spray into the nose daily.    [provider]  cetirizine (ZYRTEC) 10 MG tablet Take 10 mg by mouth.    [provider]  metroNIDAZOLE (FLAGYL) 500 MG tablet Take 1 tablet (500 mg total) by mouth 2 (two) times daily. Patient not taking: Reported on 05/11/2017 03/31/16   Emily Filbert, MD  nitrofurantoin, macrocrystal-monohydrate, (MACROBID) 100 MG capsule Take 1 capsule (100 mg total) by mouth 2 (two) times daily. Patient not taking: Reported on 05/11/2017 03/31/16   Emily Filbert, MD  ondansetron (ZOFRAN ODT) 4 MG disintegrating tablet Take 1 tablet (4  mg total) by mouth every 8 (eight) hours as needed for nausea or vomiting. Patient not taking: Reported on 03/31/2016 12/26/14   Bridget Hartshorn L, PA-C  ondansetron (ZOFRAN) 4 MG tablet Take 1 tablet (4 mg total) by mouth every 8 (eight) hours as needed. Patient not taking: Reported on 03/31/2016 06/26/15   Phineas Semen, MD  oxyCODONE-acetaminophen (ROXICET) 5-325 MG  tablet Take 1 tablet by mouth every 4 (four) hours as needed for severe pain. Patient not taking: Reported on 05/11/2017 10/07/16   Darci Current, MD      VITAL SIGNS:  Blood pressure 136/81, pulse 77, temperature 98.3 F (36.8 C), temperature source Oral, resp. rate 16, height 5\' 5"  (1.651 m), weight (!) 154.2 kg (340 lb), last menstrual period 04/29/2017, SpO2 94 %.  PHYSICAL EXAMINATION:   Physical Exam  Constitutional: She is oriented to person, place, and time and well-developed, well-nourished, and in no distress. No distress.  HENT:  Head: Normocephalic.  Eyes: No scleral icterus.  Neck: Normal range of motion. Neck supple. No JVD present. No tracheal deviation present.  Cardiovascular: Normal rate, regular rhythm and normal heart sounds. Exam reveals no gallop and no friction rub.  No murmur heard. Pulmonary/Chest: Effort normal and breath sounds normal. No respiratory distress. She has no wheezes. She has no rales. She exhibits no tenderness.  Abdominal: Soft. Bowel sounds are normal. She exhibits no distension and no mass. There is no tenderness. There is no rebound and no guarding.  Musculoskeletal: Normal range of motion. She exhibits no edema.  Neurological: She is alert and oriented to person, place, and time.  Bilateral nystagmus  Skin: Skin is warm. No rash noted. No erythema.  Psychiatric: Affect and judgment normal.      LABORATORY PANEL:   CBC Recent Labs  Lab 05/11/17 0855  WBC 8.5  HGB 12.5  HCT 37.5  PLT 267   ------------------------------------------------------------------------------------------------------------------  Chemistries  Recent Labs  Lab 05/11/17 0855  NA 137  K 4.5  CL 102  CO2 26  GLUCOSE 110*  BUN 9  CREATININE 0.86  CALCIUM 9.0   ------------------------------------------------------------------------------------------------------------------  Cardiac Enzymes No results for input(s): TROPONINI in the last 168  hours. ------------------------------------------------------------------------------------------------------------------  RADIOLOGY:  Ct Head Wo Contrast  Result Date: 05/11/2017 CLINICAL DATA:  Dizziness.  Photophobia, nausea, vomiting. EXAM: CT HEAD WITHOUT CONTRAST TECHNIQUE: Contiguous axial images were obtained from the base of the skull through the vertex without intravenous contrast. COMPARISON:  None. FINDINGS: Brain: No acute intracranial abnormality. Specifically, no hemorrhage, hydrocephalus, mass lesion, acute infarction, or significant intracranial injury. Dural calcifications noted anterior to the temporal lobe in the middle cranial fossa. Vascular: No hyperdense vessel or unexpected calcification. Skull: No acute calvarial abnormality. Sinuses/Orbits: Visualized paranasal sinuses and mastoids clear. Orbital soft tissues unremarkable. Other: None IMPRESSION: No acute intracranial abnormality. Electronically Signed   By: Charlett Nose M.D.   On: 05/11/2017 10:16    EKG:  Sinus rhythm without ST elevations or depression  IMPRESSION AND PLAN:   42 year old female with a history of sleep apnea on CPAP and vertigo who presents with dizziness.  1. Vertigo: This may be peripheral in etiology however due to bilateral nystagmus on physical examination I will order an MRI of the brain to evaluate for underlying CVA. Continue supportive management with IV anti-emetics and IV fluids.  2. OSA: Patient will have family member bring in CPAP  3. Tobacco dependence: Patient is encouraged to quit smoking. Counseling was provided for 4 minutes.  All the records are reviewed and case discussed with ED provider. Management plans discussed with the patient and she is in agreement  CODE STATUS: full  TOTAL TIME TAKING CARE OF THIS PATIENT: 42 minutes.    Kenyatte Gruber M.D on 05/11/2017 at 2:05 PM  Between 7am to 6pm - Pager - [phone]  After 6pm go to [email or web address] - Air traffic controllerpassword [email or web address]  [facility]  [facility]  Office  [phone]  CC: Primary care physician; Verner MouldFowler, Vickie A, MD

## 2017-05-11 NOTE — ED Provider Notes (Addendum)
[facility] Emergency Department Provider Note  ____________________________________________  Time seen: Approximately 9:08 AM  I have reviewed the triage vital signs and the nursing notes.   HISTORY  Chief Complaint Dizziness    HPI Olivia Lang is a 42 y.o. female w/ a hx of vertigo presenting with dizziness, nausea and vomiting and ear pain.  The patient reports that approximately 3 days ago she began to have bilateral ear pain.  Yesterday, she had 3 episodes of vomiting and today had 2 episodes of vomiting associated with room spinning sensation.  She feels that her eyes keep moving and she is unable to control them.  In the past, she has had to be hospitalized at [facility] for vertigo, but has not been following with a neurologist.  She reports negative head imaging but cannot give me the specifics.  She has recently had a mild cough with congestion and rhinorrhea no sore throat, fevers or chills.  She tried meclizine for her symptoms, but reports "it was too late, should have started taking it earlier."  The symptoms are typical of her prior vertigo.  She has no new headache, numbness weakness or tingling.  Past Medical History:  Diagnosis Date  . Gestational diabetes   . Sleep apnea   . Vertigo     There are no active problems to display for this patient.   Past Surgical History:  Procedure Laterality Date  . CESAREAN SECTION    . CHOLECYSTECTOMY      Current Outpatient Rx  . Order #: [id number] Class: Print  . Order #: [id number] Class: Historical Med  . Order #: [id number] Class: Historical Med  . Order #: [id number] Class: Print  . Order #: [id number] Class: Print  . Order #: [id number] Class: Print  . Order #: [id number] Class: Print  . Order #: [id number] Class: Print    Allergies Codeine  No family history on file.  Social History Social History   Tobacco Use  . Smoking status: Former Smoker    Types: Cigarettes  . Smokeless tobacco: Never  Used  Substance Use Topics  . Alcohol use: No    Comment: socially  . Drug use: Yes    Types: Marijuana    Comment: every weekend    Review of Systems Constitutional: No fever/chills.  No lightheadedness.  Positive dizziness with room spinning sensation.  No syncope. Eyes: Positive photosensitivity with uncontrollable nystagmus. ENT: No sore throat.  Positive congestion and rhinorrhea. Cardiovascular: Denies chest pain. Denies palpitations. Respiratory: Denies shortness of breath.  Positive mild nonproductive cough. Gastrointestinal: No abdominal pain.  Positive nausea, positive vomiting.  No diarrhea.  No constipation. Genitourinary: Negative for dysuria. Musculoskeletal: Negative for back pain. Skin: Negative for rash. Neurological: Negative for headaches. No focal numbness, tingling or weakness.  Difficulty walking due to dizziness.    ____________________________________________   PHYSICAL EXAM:  VITAL SIGNS: ED Triage Vitals  Enc Vitals Group     BP 05/11/17 0856 138/71     Pulse Rate 05/11/17 0856 74     Resp 05/11/17 0856 16     Temp 05/11/17 0856 98.3 F (36.8 C)     Temp Source 05/11/17 0856 Oral     SpO2 05/11/17 0856 99 %     Weight 05/11/17 0857 (!) 340 lb (154.2 kg)     Height 05/11/17 0857 5\' 5"  (1.651 m)     Head Circumference --      Peak Flow --      Pain Score 05/11/17 0856 10  Pain Loc --      Pain Edu? --      Excl. in GC? --     Constitutional: Alert and oriented. Answers questions appropriately.  The patient is lying in the stretcher with discomfort, tell of her face and significant increase in her symptoms when she opens her eyes. Eyes: Conjunctivae are normal.  EOMI.  Severe left lateral nystagmus.  PERRLA.  Positive photosensitivity.. No scleral icterus. EARS: TMs are clear without any bulge erythema or fluid bilaterally.  The canals are clear as well. Head: Atraumatic. Nose: No congestion/rhinnorhea. Mouth/Throat: Mucous membranes are  moist.  Neck: No stridor.  Supple.  No JVD.  No meningismus. Cardiovascular: Normal rate, regular rhythm. No murmurs, rubs or gallops.  Respiratory: Normal respiratory effort.  No accessory muscle use or retractions. Lungs CTAB.  No wheezes, rales or ronchi. Gastrointestinal: Morbidly obese.  Soft, nontender and nondistended.  No guarding or rebound.  No peritoneal signs. Musculoskeletal: No LE edema.  Neurologic:  A&Ox3.  Speech is clear.  Face and smile are symmetric.  EOMI.  Moves all extremities well. Skin:  Skin is warm, dry and intact. No rash noted. Psychiatric: Mood and affect are normal. Speech and behavior are normal.  Normal judgement.  ____________________________________________   LABS (all labs ordered are listed, but only abnormal results are displayed)  Labs Reviewed  BASIC METABOLIC PANEL - Abnormal; Notable for the following components:      Result Value   Glucose, Bld 110 (*)    All other components within normal limits  CBC - Abnormal; Notable for the following components:   RDW 15.6 (*)    All other components within normal limits  URINALYSIS, COMPLETE (UACMP) WITH MICROSCOPIC  CBG MONITORING, ED  POC URINE PREG, ED   ____________________________________________  EKG  ED ECG REPORT I, Rockne MenghiniNorman, Anne-Caroline, the attending physician, personally viewed and interpreted this ECG.   Date: 05/11/2017  EKG Time: 903  Rate: 79  Rhythm: normal sinus rhythm  Axis: normal  Intervals:none  ST&T Change: Nonspecific T wave inversion in V1.  No STEMI.  ____________________________________________  RADIOLOGY  Ct Head Wo Contrast  Result Date: 05/11/2017 CLINICAL DATA:  Dizziness.  Photophobia, nausea, vomiting. EXAM: CT HEAD WITHOUT CONTRAST TECHNIQUE: Contiguous axial images were obtained from the base of the skull through the vertex without intravenous contrast. COMPARISON:  None. FINDINGS: Brain: No acute intracranial abnormality. Specifically, no hemorrhage,  hydrocephalus, mass lesion, acute infarction, or significant intracranial injury. Dural calcifications noted anterior to the temporal lobe in the middle cranial fossa. Vascular: No hyperdense vessel or unexpected calcification. Skull: No acute calvarial abnormality. Sinuses/Orbits: Visualized paranasal sinuses and mastoids clear. Orbital soft tissues unremarkable. Other: None IMPRESSION: No acute intracranial abnormality. Electronically Signed   By: Charlett NoseKevin  Dover M.D.   On: 05/11/2017 10:16    ____________________________________________   PROCEDURES  Procedure(s) performed: None  Procedures  Critical Care performed: No ____________________________________________   INITIAL IMPRESSION / ASSESSMENT AND PLAN / ED COURSE  Pertinent labs & imaging results that were available during my care of the patient were reviewed by me and considered in my medical decision making (see chart for details).  42 y.o. female with a history of vertigo presenting with vertiginous symptoms.  Overall, the patient is hemodynamically stable.  Most likely, she is having vertigo, especially after her recent URI symptoms.  However, will get a CT scan to rule out any intracranial pathology.  In the meantime, I will initiate aggressive symptomatic treatment.  If she does  not improve, will consider additional imaging including posterior stroke evaluation with CT angiogram or MRI, but this is less likely to be a central cause rather than a peripheral cause of vertigo.  Plan reevaluation for final disposition.  ----------------------------------------- 1:11 PM on 05/11/2017 -----------------------------------------  The patient CT scan does not show any acute intracranial abnormality.  Her laboratory studies are reassuring.  Her urinalysis is pending.  Clinically, the patient has had 2 rounds of Valium and meclizine with intravenous fluids and does not feel that she has improved.  She was able to ambulate to the bathroom,  but immediately began to feel dizzy like the room was spinning and had associated nausea.  At this time, I will plan to talk with Dr. Thad Ranger the neurologist on call, as well as admit the patient for continued symptomatic treatment.  Upon review of the patient's admission in March 2017 at [facility], the patient underwent MRI imaging without any found abnormalities.  She was treated with multiple doses of medications, including a Medrol Dosepak for possible vestibular neuritis, and was discharged with vestibular rehab.  ----------------------------------------- 1:22 PM on 05/11/2017 -----------------------------------------  I have spoken with Dr. Thad Ranger, who agrees that no additional imaging is indicated at this time, but that the patient should be admitted for continued symptomatic treatment.  We will continue to dose her with Valium every 4 hours and follow her clinical course.  We will also start her on prednisone for possible vestibular neuritis.  Now the patient has developed a headache, and I will treat her with Toradol.  Vestibular migraine is also on the differential. 

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
## 2017-05-11 NOTE — Progress Notes (Signed)
Pharmacist - Prescriber Communication  Enoxaparin dose modified from 40 mg subcutaneously once daily to 40 mg subcutaneously twice daily due to BMI greater than 40.  Brandon Scarbrough A. [place], VermontPharm.D., BCPS Clinical Pharmacist 05/11/17 16:23
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
## 2017-05-12 ENCOUNTER — Inpatient Hospital Stay: Payer: Medicaid Other

## 2017-05-12 DIAGNOSIS — R42 Dizziness and giddiness: Secondary | ICD-10-CM

## 2017-05-12 LAB — BASIC METABOLIC PANEL
ANION GAP: 7 (ref 5–15)
BUN: 8 mg/dL (ref 6–20)
CO2: 25 mmol/L (ref 22–32)
CREATININE: 0.73 mg/dL (ref 0.44–1.00)
Calcium: 8.6 mg/dL — ABNORMAL LOW (ref 8.9–10.3)
Chloride: 107 mmol/L (ref 101–111)
GFR calc Af Amer: >60 mL/min (ref >60)
GLUCOSE: 102 mg/dL — AB (ref 65–99)
Potassium: 3.9 mmol/L (ref 3.5–5.1)
Sodium: 139 mmol/L (ref 135–145)

## 2017-05-12 LAB — CBC
HCT: 35.2 % (ref 35.0–47.0)
HEMOGLOBIN: 11.5 g/dL — AB (ref 12.0–16.0)
MCH: 26.4 pg (ref 26.0–34.0)
MCHC: 32.5 g/dL (ref 32.0–36.0)
MCV: 81.3 fL (ref 80.0–100.0)
Platelets: 249 10*3/uL (ref 150–440)
RBC: 4.33 MIL/uL (ref 3.80–5.20)
RDW: 15.4 % — ABNORMAL HIGH (ref 11.5–14.5)
WBC: 9.8 10*3/uL (ref 3.6–11.0)

## 2017-05-12 NOTE — Consult Note (Signed)
Reason for Consult:Vertigo Referring Physician: Gouru  CC: Vertigo  HPI: Olivia Lang is an 42 y.o. female with a hx of vertigo presenting with dizziness, nausea and vomiting and ear pain.  The patient reports that approximately 4 days ago she began to have bilateral ear pain.  Wednesday, she had 3 episodes of vomiting and yesterday had 2 episodes of vomiting associated with room spinning sensation.  She feels that her eyes keep moving and she is unable to control them.  In the past, she has had to be hospitalized at [facility] for vertigo, but has not been following with a neurologist.  She reports negative work up at that time.  She has recently had a mild cough with congestion and rhinorrhea no sore throat, fevers or chills.  She tried meclizine for her symptoms, but reports "it was too late.  Past Medical History:  Diagnosis Date  . Gestational diabetes   . Sleep apnea   . Vertigo     Past Surgical History:  Procedure Laterality Date  . CESAREAN SECTION    . CHOLECYSTECTOMY      Family history: Father with heart disease.  Mother alive and well.  Social History:  reports that she has quit smoking. Her smoking use included cigarettes. she has never used smokeless tobacco. She reports that she uses drugs. Drug: Marijuana. She reports that she does not drink alcohol.  Allergies  Allergen Reactions  . Codeine Palpitations    Medications:  I have reviewed the patient's current medications. Prior to Admission:  Medications Prior to Admission  Medication Sig Dispense Refill Last Dose  . meclizine (ANTIVERT) 25 MG tablet Take 1 tablet (25 mg total) by mouth 3 (three) times daily as needed for dizziness. 30 tablet 0 05/10/2017 at Unknown time  . Azelastine HCl (ASTEPRO NA) Place 1 spray into the nose daily.   prn at prn  . cetirizine (ZYRTEC) 10 MG tablet Take 10 mg by mouth.   prn at prn  . metroNIDAZOLE (FLAGYL) 500 MG tablet Take 1 tablet (500 mg total) by mouth 2 (two) times daily.  (Patient not taking: Reported on 05/11/2017) 14 tablet 0 Not Taking at Unknown time  . nitrofurantoin, macrocrystal-monohydrate, (MACROBID) 100 MG capsule Take 1 capsule (100 mg total) by mouth 2 (two) times daily. (Patient not taking: Reported on 05/11/2017) 20 capsule 0 Not Taking at Unknown time  . ondansetron (ZOFRAN ODT) 4 MG disintegrating tablet Take 1 tablet (4 mg total) by mouth every 8 (eight) hours as needed for nausea or vomiting. (Patient not taking: Reported on 03/31/2016) 20 tablet 0 Not Taking at Unknown time  . ondansetron (ZOFRAN) 4 MG tablet Take 1 tablet (4 mg total) by mouth every 8 (eight) hours as needed. (Patient not taking: Reported on 03/31/2016) 20 tablet 0 prn at prn  . oxyCODONE-acetaminophen (ROXICET) 5-325 MG tablet Take 1 tablet by mouth every 4 (four) hours as needed for severe pain. (Patient not taking: Reported on 05/11/2017) 20 tablet 0 Not Taking at Unknown time   Scheduled: . enoxaparin (LOVENOX) injection  40 mg Subcutaneous BID  . loratadine  10 mg Oral Daily    ROS: History obtained from the patient  General ROS: negative for - chills, fatigue, fever, night sweats, weight gain or weight loss Psychological ROS: negative for - behavioral disorder, hallucinations, memory difficulties, mood swings or suicidal ideation Ophthalmic ROS: Photophobia ENT ROS: as noted in HPI Allergy and Immunology ROS: negative for - hives or itchy/watery eyes Hematological and Lymphatic  ROS: negative for - bleeding problems, bruising or swollen lymph nodes Endocrine ROS: negative for - galactorrhea, hair pattern changes, polydipsia/polyuria or temperature intolerance Respiratory ROS: negative for - cough, hemoptysis, shortness of breath or wheezing Cardiovascular ROS: negative for - chest pain, dyspnea on exertion, edema or irregular heartbeat Gastrointestinal ROS: negative for - abdominal pain, diarrhea, hematemesis, nausea/vomiting or stool incontinence Genito-Urinary ROS:  negative for - dysuria, hematuria, incontinence or urinary frequency/urgency Musculoskeletal ROS: negative for - joint swelling or muscular weakness Neurological ROS: as noted in HPI Dermatological ROS: negative for rash and skin lesion changes  Physical Examination: Blood pressure 120/70, pulse 75, temperature 98.1 F (36.7 C), temperature source Oral, resp. rate 20, height '5\' 5"'$  (1.651 m), weight (!) 161.4 kg (355 lb 12.8 oz), last menstrual period 04/29/2017, SpO2 99 %.  HEENT-  Normocephalic, no lesions, without obvious abnormality.  Normal external eye and conjunctiva.  Normal TM's bilaterally.  Normal auditory canals and external ears. Normal external nose, mucus membranes and septum.  Normal pharynx. Cardiovascular- S1, S2 normal, pulses palpable throughout   Lungs- chest clear, no wheezing, rales, normal symmetric air entry Abdomen- soft, non-tender; bowel sounds normal; no masses,  no organomegaly Extremities- no edema Lymph-no adenopathy palpable Musculoskeletal-no joint tenderness, deformity or swelling Skin-warm and dry, no hyperpigmentation, vitiligo, or suspicious lesions  Neurological Examination   Mental Status: Alert, oriented, thought content appropriate.  Speech fluent without evidence of aphasia.  Able to follow 3 step commands without difficulty. Cranial Nerves: II: Discs flat bilaterally; Visual fields grossly normal, pupils equal, round, reactive to light and accommodation III,IV, VI: ptosis not present, extra-ocular motions intact bilaterally.  Rotary nystagmus particularly when head turned to the right but also noted on forward gaze.  Less prominent with head turned to the left or leftward gaze.   V,VII: smile symmetric, facial light touch sensation normal bilaterally VIII: hearing normal bilaterally IX,X: gag reflex present XI: bilateral shoulder shrug XII: midline tongue extension Motor: Right : Upper extremity   5/5    Left:     Upper extremity   5/5  Lower  extremity   5/5     Lower extremity   5/5 Tone and bulk:normal tone throughout; no atrophy noted Sensory: Pinprick and light touch intact throughout, bilaterally Deep Tendon Reflexes: 2+ in the upper extremities and absent in the lower extremities Plantars: Right: downgoing   Left: downgoing Cerebellar: Normal finger-to-nose and normal heel-to-shin testing bilaterally Gait: not tested due to safety concerns    Laboratory Studies:   Basic Metabolic Panel: Recent Labs  Lab 05/11/17 0855 05/12/17 0255  NA 137 139  K 4.5 3.9  CL 102 107  CO2 26 25  GLUCOSE 110* 102*  BUN 9 8  CREATININE 0.86 0.73  CALCIUM 9.0 8.6*    Liver Function Tests: No results for input(s): AST, ALT, ALKPHOS, BILITOT, PROT, ALBUMIN in the last 168 hours. No results for input(s): LIPASE, AMYLASE in the last 168 hours. No results for input(s): AMMONIA in the last 168 hours.  CBC: Recent Labs  Lab 05/11/17 0855 05/12/17 0255  WBC 8.5 9.8  HGB 12.5 11.5*  HCT 37.5 35.2  MCV 80.5 81.3  PLT 267 249    Cardiac Enzymes: No results for input(s): CKTOTAL, CKMB, CKMBINDEX, TROPONINI in the last 168 hours.  BNP: Invalid input(s): POCBNP  CBG: No results for input(s): GLUCAP in the last 168 hours.  Microbiology: Results for orders placed or performed during the hospital encounter of 03/31/16  Wet prep, genital  Status: Abnormal   Collection Time: 03/31/16 11:24 AM  Result Value Ref Range Status   Yeast Wet Prep HPF POC NONE SEEN NONE SEEN Final    Comment: Specimen diluted due to transport tube containing more than 1 ml of saline, interpret results with caution.   Trich, Wet Prep NONE SEEN NONE SEEN Final   Clue Cells Wet Prep HPF POC PRESENT (A) NONE SEEN Final   WBC, Wet Prep HPF POC FEW (A) NONE SEEN Final   Sperm NONE SEEN  Final  Chlamydia/NGC rt PCR ([facility] only)     Status: None   Collection Time: 03/31/16 11:24 AM  Result Value Ref Range Status   Specimen source GC/Chlam ENDOCERVICAL   Final   Chlamydia Tr NOT DETECTED NOT DETECTED Final   N gonorrhoeae NOT DETECTED NOT DETECTED Final    Comment: (NOTE) 100  This methodology has not been evaluated in pregnant women or in 200  patients with a history of hysterectomy. 300 400  This methodology will not be performed on patients less than 15  years of age.     Coagulation Studies: No results for input(s): LABPROT, INR in the last 72 hours.  Urinalysis:  Recent Labs  Lab 05/11/17 0855  COLORURINE YELLOW*  LABSPEC 1.015  PHURINE 7.0  GLUCOSEU NEGATIVE  HGBUR NEGATIVE  BILIRUBINUR NEGATIVE  KETONESUR NEGATIVE  PROTEINUR NEGATIVE  NITRITE NEGATIVE  LEUKOCYTESUR NEGATIVE    Lipid Panel:  No results found for: CHOL, TRIG, HDL, CHOLHDL, VLDL, LDLCALC  HgbA1C: No results found for: HGBA1C  Urine Drug Screen:  No results found for: LABOPIA, COCAINSCRNUR, LABBENZ, AMPHETMU, THCU, LABBARB  Alcohol Level: No results for input(s): ETH in the last 168 hours.  Other results: EKG: Sinus rhythm at 79 bpm.  Imaging: Ct Head Wo Contrast  Result Date: 05/11/2017 CLINICAL DATA:  Dizziness.  Photophobia, nausea, vomiting. EXAM: CT HEAD WITHOUT CONTRAST TECHNIQUE: Contiguous axial images were obtained from the base of the skull through the vertex without intravenous contrast. COMPARISON:  None. FINDINGS: Brain: No acute intracranial abnormality. Specifically, no hemorrhage, hydrocephalus, mass lesion, acute infarction, or significant intracranial injury. Dural calcifications noted anterior to the temporal lobe in the middle cranial fossa. Vascular: No hyperdense vessel or unexpected calcification. Skull: No acute calvarial abnormality. Sinuses/Orbits: Visualized paranasal sinuses and mastoids clear. Orbital soft tissues unremarkable. Other: None IMPRESSION: No acute intracranial abnormality. Electronically Signed   By: Rolm Baptise M.D.   On: 05/11/2017 10:16     Assessment/Plan: 42 year old female with a history of vertigo  presenting now with recurrent vertigo nausea and vomiting.  Patient has been treated with Meclizine and Valium.  Although her symptoms are not completely resolved they are improved.  She does continue to have some nystagmus.  It is somewhat positional.  She has had a negative workup in the past.  Head CT here reviewed and shows no acute changes.  The patient is unable to fit in our MRI scanner.  She has a normal neurological exam other than her nystagmus.  Recommendations: 1. TSH, B12, ESR 2. Continue Valium/Meclizine dosing 3. Vestibular therapy  Alexis Goodell, MD Neurology [phone] 05/12/2017, 12:13 PM

## 2017-05-12 NOTE — Progress Notes (Signed)
[facility]  at [facility]   PATIENT NAME: Olivia Billsiffany Leatham    MR#:  [id number]  DATE OF BIRTH:  09/29/1975  SUBJECTIVE:  CHIEF COMPLAINT: Patient is still complaining of vertigo associated with nausea and headache.  Denies any vomiting.  She is very hungry and wants to eat.  REVIEW OF SYSTEMS:  CONSTITUTIONAL: No fever, fatigue or weakness.  EYES: No blurred or double vision.  Patient is reporting headache EARS, NOSE, AND THROAT: No tinnitus or ear pain.  RESPIRATORY: No cough, shortness of breath, wheezing or hemoptysis.  CARDIOVASCULAR: No chest pain, orthopnea, edema.  GASTROINTESTINAL: Reports nausea, denies vomiting, diarrhea or abdominal pain.  GENITOURINARY: No dysuria, hematuria.  ENDOCRINE: No polyuria, nocturia,  HEMATOLOGY: No anemia, easy bruising or bleeding SKIN: No rash or lesion. MUSCULOSKELETAL: No joint pain or arthritis.   NEUROLOGIC: Reporting vertigo, no tingling, numbness, weakness.  PSYCHIATRY: No anxiety or depression.   DRUG ALLERGIES:   Allergies  Allergen Reactions  . Codeine Palpitations    VITALS:  Blood pressure 120/70, pulse 75, temperature 98.1 F (36.7 C), temperature source Oral, resp. rate 20, height 5\' 5"  (1.651 m), weight (!) 161.4 kg (355 lb 12.8 oz), last menstrual period 04/29/2017, SpO2 99 %.  PHYSICAL EXAMINATION:  GENERAL:  42 y.o.-year-old patient lying in the bed with no acute distress.  EYES: Pupils equal, round, reactive to light and accommodation. No scleral icterus. Extraocular muscles intact.  HEENT: Head atraumatic, normocephalic. Oropharynx and nasopharynx clear.  NECK:  Supple, no jugular venous distention. No thyroid enlargement, no tenderness.  LUNGS: Normal breath sounds bilaterally, no wheezing, rales,rhonchi or crepitation. No use of accessory muscles of respiration.  CARDIOVASCULAR: S1, S2 normal. No murmurs, rubs, or gallops.  ABDOMEN: Soft, nontender, nondistended. Bowel sounds  present. No organomegaly or mass.  EXTREMITIES: No pedal edema, cyanosis, or clubbing.  NEUROLOGIC: Cranial nerves II through XII are intact. Muscle strength 5/5 in all extremities. Sensation intact. Gait not checked.  PSYCHIATRIC: The patient is alert and oriented x 3.  SKIN: No obvious rash, lesion, or ulcer.    LABORATORY PANEL:   CBC Recent Labs  Lab 05/12/17 0255  WBC 9.8  HGB 11.5*  HCT 35.2  PLT 249   ------------------------------------------------------------------------------------------------------------------  Chemistries  Recent Labs  Lab 05/12/17 0255  NA 139  K 3.9  CL 107  CO2 25  GLUCOSE 102*  BUN 8  CREATININE 0.73  CALCIUM 8.6*   ------------------------------------------------------------------------------------------------------------------  Cardiac Enzymes No results for input(s): TROPONINI in the last 168 hours. ------------------------------------------------------------------------------------------------------------------  RADIOLOGY:  Ct Head Wo Contrast  Result Date: 05/11/2017 CLINICAL DATA:  Dizziness.  Photophobia, nausea, vomiting. EXAM: CT HEAD WITHOUT CONTRAST TECHNIQUE: Contiguous axial images were obtained from the base of the skull through the vertex without intravenous contrast. COMPARISON:  None. FINDINGS: Brain: No acute intracranial abnormality. Specifically, no hemorrhage, hydrocephalus, mass lesion, acute infarction, or significant intracranial injury. Dural calcifications noted anterior to the temporal lobe in the middle cranial fossa. Vascular: No hyperdense vessel or unexpected calcification. Skull: No acute calvarial abnormality. Sinuses/Orbits: Visualized paranasal sinuses and mastoids clear. Orbital soft tissues unremarkable. Other: None IMPRESSION: No acute intracranial abnormality. Electronically Signed   By: Charlett NoseKevin  Dover M.D.   On: 05/11/2017 10:16    EKG:   Orders placed or performed during the hospital encounter of  05/11/17  . ED EKG  . ED EKG  . EKG 12-Lead  . EKG 12-Lead    ASSESSMENT AND PLAN:   42 year old female with a  history of sleep apnea on CPAP and vertigo who presents with dizziness.  1. Vertigo: This may be peripheral in etiology Patient has bilateral nystagmus on physical examination , MRI was not done because of patient's body habitus Continue supportive management with IV anti-emetics and IV fluids. -Neurology consulted  2. OSA:  CPAP nightly  3.   Morbid obesity patient needs lifestyle modifications once patient is clinically stable   4.Tobacco dependence: Patient is encouraged to quit smoking. Counseling was provided       All the records are reviewed and case discussed with Care Management/Social Workerr. Management plans discussed with the patient, family and they are in agreement.  CODE STATUS: fc   TOTAL TIME TAKING CARE OF THIS PATIENT: 36 minutes.   POSSIBLE D/C IN 1-2 DAYS, DEPENDING ON CLINICAL CONDITION.  Note: This dictation was prepared with Dragon dictation along with smaller phrase technology. Any transcriptional errors that result from this process are unintentional.   Ramonita Lab M.D on 05/12/2017 at 2:16 PM  Between 7am to 6pm - Pager - [phone] After 6pm go to [email or web address] - password [email or web address] [facility]  [facility]  Office  [phone]  CC: Primary care physician; Verner Mould, MD

## 2017-05-12 NOTE — Therapy (Signed)
Pt refuses to wear CPAP.  °

## 2017-05-12 NOTE — Progress Notes (Signed)
Spoke with RT regarding the condition of pt CPAP machine. Per pt family brought the CPAP from home and water got into machine thus pt cannot use home CPAP. RT provide a CPAP from hospital for pt. Will continue to monitor.

## 2017-05-13 LAB — SEDIMENTATION RATE: SED RATE: 36 mm/h — AB (ref 0–20)

## 2017-05-13 LAB — TSH: TSH: 3.433 u[IU]/mL (ref 0.350–4.500)

## 2017-05-13 LAB — HIV ANTIBODY (ROUTINE TESTING W REFLEX): HIV SCREEN 4TH GENERATION: NONREACTIVE

## 2017-05-13 LAB — VITAMIN B12: VITAMIN B 12: 281 pg/mL (ref 180–914)

## 2017-05-13 MED ORDER — PREDNISONE 10 MG (21) PO TBPK: 10.0000 mg | ORAL_TABLET | Freq: Every day | ORAL | 0 refills | Status: AC

## 2017-05-13 MED ORDER — DIAZEPAM 2 MG PO TABS: 2.0000 mg | ORAL_TABLET | Freq: Four times a day (QID) | ORAL | 0 refills | Status: AC | PRN

## 2017-05-13 MED ORDER — MECLIZINE HCL 25 MG PO TABS: 25.0000 mg | ORAL_TABLET | Freq: Three times a day (TID) | ORAL | 0 refills | Status: AC | PRN

## 2017-05-13 NOTE — Discharge Summary (Signed)
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 Gestational diabetes   . Sleep apnea   . Vertigo     HOSPITAL COURSE:   HPI  Olivia Lang  is a 42 y.o. female with a known history of vertigo who presents with above complaint. Patient presently over the past several days she has had vertigo. She also noticed that her eyes are auscultating. She had a upper respiratory infection over the past few days. She felt like she had a sinus headache this morning. She denies focal neurological deficits. She has also had nausea and vomiting associated with the dizziness. She tried meclizine however it did not help.  Patient has been given Valium and meclizine along with IV fluids in the emergency room but continues to have these symptoms. ED physician also spoke with Dr. Thad Ranger regarding the vertigo.   1. Vertigo: This may be peripheral in etiology Patient has bilateral nystagmus on physical examination , MRI was not done because of patient's body habitus Clinically improved with IV anti-emetics and IV fluids. -Neurology consulted.  TSH is normal.  Sed rate 36.  HIV nonreactive -Feeling much better.  Okay to discharge patient with Valium and meclizine -Patient was evaluated by physical therapy who is recommending outpatient vestibular rehab -Follow-up with the ENT outpatient middle ear effusion, discharging with prednisone  2. OSA:  CPAP nightly, patient is refusing to wear CPAP  3.  Morbid obesity patient  needs lifestyle modifications once patient is clinically stable   4.Tobacco dependence: Patient is encouraged to quit smoking. Counseling was provided       DISCHARGE CONDITIONS:   stable  CONSULTS OBTAINED:  Treatment Team:  Thana Farr, MD   PROCEDURES  none  DRUG ALLERGIES:   Allergies  Allergen Reactions  . Codeine Palpitations    DISCHARGE MEDICATIONS:   Allergies as of 05/13/2017      Reactions   Codeine Palpitations      Medication List    STOP taking these medications   metroNIDAZOLE 500 MG tablet Commonly known as:  FLAGYL   nitrofurantoin (macrocrystal-monohydrate) 100 MG capsule Commonly known as:  MACROBID   ondansetron 4 MG tablet Commonly known as:  ZOFRAN     TAKE these medications   ASTEPRO NA Place 1 spray into the nose daily.   cetirizine 10 MG tablet Commonly known as:  ZYRTEC Take 10 mg by mouth.   diazepam 2 MG tablet Commonly known as:  VALIUM Take 1 tablet (2 mg total) by mouth every 6 (six) hours as needed (vertigo).   meclizine 25 MG tablet Commonly known as:  ANTIVERT Take 1 tablet (25 mg total) by mouth 3 (three) times daily as needed for dizziness.   ondansetron 4 MG disintegrating tablet Commonly known as:  ZOFRAN ODT Take 1 tablet (4 mg total) by mouth every 8 (eight) hours as needed for nausea or vomiting.   oxyCODONE-acetaminophen 5-325 MG tablet Commonly known as:  ROXICET Take 1 tablet by mouth every 4 (four) hours as needed  for severe pain.   predniSONE 10 MG (21) Tbpk tablet Commonly known as:  STERAPRED UNI-PAK 21 TAB Take 1 tablet (10 mg total) by mouth daily. Take 6 tablets by mouth for 1 day followed by  5 tablets by mouth for 1 day followed by  4 tablets by mouth for 1 day followed by  3 tablets by mouth for 1 day followed by  2 tablets by mouth for 1 day followed by  1 tablet by mouth for a day and stop        DISCHARGE INSTRUCTIONS:   We will up with primary care physician in 5-7  days Outpatient follow-up with neurology in 3-4 weeks Outpatient follow-up with ENT Dr. Jenne CampusMcQueen in 2 weeks Outpatient follow-up with vestibular rehabilitation center  DIET:  Cardiac diet  DISCHARGE CONDITION:  Stable  ACTIVITY:  Activity as tolerated  OXYGEN:  Home Oxygen: No.   Oxygen Delivery: room air  DISCHARGE LOCATION:  home   If you experience worsening of your admission symptoms, develop shortness of breath, life threatening emergency, suicidal or homicidal thoughts you must seek medical attention immediately by calling 911 or calling your MD immediately  if symptoms less severe.  You Must read complete instructions/literature along with all the possible adverse reactions/side effects for all the Medicines you take and that have been prescribed to you. Take any new Medicines after you have completely understood and accpet all the possible adverse reactions/side effects.   Please note  You were cared for by a hospitalist during your hospital stay. If you have any questions about your discharge medications or the care you received while you were in the hospital after you are discharged, you can call the unit and asked to speak with the hospitalist on call if the hospitalist that took care of you is not available. Once you are discharged, your primary care physician will handle any further medical issues. Please note that NO REFILLS for any discharge medications will be authorized once you are discharged, as it is imperative that you return to your primary care physician (or establish a relationship with a primary care physician if you do not have one) for your aftercare needs so that they can reassess your need for medications and monitor your lab values.     Today  Chief Complaint  Patient presents with  . Dizziness   Patient is feeling much better.  Denies any nausea headache or dizziness.  Ambulated with the help of physical therapy and wants to go home  ROS:   CONSTITUTIONAL: Denies fevers, chills. Denies any fatigue, weakness.  EYES: Denies blurry vision, double vision, eye pain. EARS, NOSE, THROAT: Denies tinnitus, ear pain, hearing loss. RESPIRATORY: Denies cough, wheeze, shortness of breath.  CARDIOVASCULAR: Denies chest pain, palpitations, edema.  GASTROINTESTINAL: Denies nausea, vomiting, diarrhea, abdominal pain. Denies bright red blood per rectum. GENITOURINARY: Denies dysuria, hematuria. ENDOCRINE: Denies nocturia or thyroid problems. HEMATOLOGIC AND LYMPHATIC: Denies easy bruising or bleeding. SKIN: Denies rash or lesion. MUSCULOSKELETAL: Denies pain in neck, back, shoulder, knees, hips or arthritic symptoms.  NEUROLOGIC: Denies paralysis, paresthesias.  PSYCHIATRIC: Denies anxiety or depressive symptoms.   VITAL SIGNS:  Blood pressure (!) 107/50, pulse 75, temperature 98.6 F (37 C), temperature source Oral, resp. rate 18, height 5\' 5"  (1.651 m), weight (!) 160.1 kg (353 lb), last menstrual period 04/29/2017, SpO2 100 %.  I/O:    Intake/Output Summary (Last 24 hours) at 05/13/2017 1513 Last data filed at 05/13/2017 1200 Gross per 24 hour  Intake 480 ml  Output -  Net 480 ml    PHYSICAL EXAMINATION:  GENERAL:  42 y.o.-year-old patient lying in the bed with no acute distress.  EYES: Pupils equal, round, reactive to light and accommodation. No scleral icterus. Extraocular muscles intact.  HEENT: Head atraumatic, normocephalic. Oropharynx and nasopharynx clear.  NECK:  Supple, no jugular venous distention. No thyroid enlargement, no tenderness.  LUNGS: Normal breath sounds bilaterally, no wheezing, rales,rhonchi or crepitation. No use of accessory muscles of respiration.  CARDIOVASCULAR: S1, S2 normal. No murmurs, rubs, or gallops.  ABDOMEN: Soft, non-tender, non-distended. Bowel sounds present. No organomegaly or mass.  EXTREMITIES: No pedal edema, cyanosis, or clubbing.  NEUROLOGIC: Cranial nerves II through XII are intact.  Muscle strength 5/5 in all extremities. Sensation intact. Gait not checked.  PSYCHIATRIC: The patient is alert and oriented x 3.  SKIN: No obvious rash, lesion, or ulcer.   DATA REVIEW:   CBC Recent Labs  Lab 05/12/17 0255  WBC 9.8  HGB 11.5*  HCT 35.2  PLT 249    Chemistries  Recent Labs  Lab 05/12/17 0255  NA 139  K 3.9  CL 107  CO2 25  GLUCOSE 102*  BUN 8  CREATININE 0.73  CALCIUM 8.6*    Cardiac Enzymes No results for input(s): TROPONINI in the last 168 hours.  Microbiology Results  Results for orders placed or performed during the hospital encounter of 03/31/16  Wet prep, genital     Status: Abnormal   Collection Time: 03/31/16 11:24 AM  Result Value Ref Range Status   Yeast Wet Prep HPF POC NONE SEEN NONE SEEN Final    Comment: Specimen diluted due to transport tube containing more than 1 ml of saline, interpret results with caution.   Trich, Wet Prep NONE SEEN NONE SEEN Final   Clue Cells Wet Prep HPF POC PRESENT (A) NONE SEEN Final   WBC, Wet Prep HPF POC FEW (A) NONE SEEN Final   Sperm NONE SEEN  Final  Chlamydia/NGC rt PCR ([facility] only)     Status: None   Collection Time: 03/31/16 11:24 AM  Result Value Ref Range Status   Specimen source GC/Chlam ENDOCERVICAL  Final   Chlamydia Tr NOT DETECTED NOT DETECTED Final   N gonorrhoeae NOT DETECTED NOT DETECTED Final    Comment: (NOTE) 100  This methodology has not been evaluated in pregnant women or in 200  patients with a history of hysterectomy. 300 400  This methodology will not be performed on patients less than 13  years of age.     RADIOLOGY:  Ct Head Wo Contrast  Result Date: 05/11/2017 CLINICAL DATA:  Dizziness.  Photophobia, nausea, vomiting. EXAM: CT HEAD WITHOUT CONTRAST TECHNIQUE: Contiguous axial images were obtained from the base of the skull through the vertex without intravenous contrast. COMPARISON:  None. FINDINGS: Brain: No acute intracranial abnormality. Specifically, no hemorrhage,  hydrocephalus, mass lesion, acute infarction, or significant intracranial injury. Dural calcifications noted anterior to the temporal lobe in the middle cranial fossa. Vascular: No hyperdense vessel or unexpected calcification. Skull: No acute calvarial abnormality. Sinuses/Orbits: Visualized paranasal sinuses and mastoids clear. Orbital soft tissues unremarkable. Other: None IMPRESSION: No acute intracranial abnormality. Electronically Signed   By: Charlett Nose M.D.   On: 05/11/2017 10:16    EKG:   Orders placed or performed during the hospital encounter of 05/11/17  . ED EKG  . ED EKG  . EKG 12-Lead  . EKG 12-Lead      Management  plans discussed with the patient, family and they are in agreement.  CODE STATUS:     Code Status Orders  (From admission, onward)        Start     Ordered   05/11/17 1621  Full code  Continuous     05/11/17 1620    Code Status History    Date Active Date Inactive Code Status Order ID Comments User Context   This patient has a current code status but no historical code status.      TOTAL TIME TAKING CARE OF THIS PATIENT: 43  minutes.   Note: This dictation was prepared with Dragon dictation along with smaller phrase technology. Any transcriptional errors that result from this process are unintentional.   @M[facility] @  on 05/13/2017 at 3:13 PM  Between 7am to 6pm - Pager - [phone]  After 6pm go to [email or web address] - password [email or web address] [facility]  [facility]  Office  [phone]  CC: Primary care physician; Verner Mould, MD

## 2017-05-13 NOTE — Evaluation (Signed)
Physical Therapy Evaluation Patient Details Name: Olivia Lang MRN: [id number] DOB: 02/22/1976 Today's Date: 05/13/2017   History of Present Illness  Olivia Lang  is a 42 y.o. female with a known history of vertigo who presents with dizziness and nausea.  Clinical Impression  Pt presents to PT with dizziness and vertigo which is limiting pt's functional mobility.  Pt is able to safely mobilize in her room and hospital hallway using RW and Mod I assist.  Pt performed VOR exercises and had saccades during tracking exercises.  Pt also reporting double vision which is currently resolved.  Recommend outpatient vestibular rehab after discharge for specific and ongoing monitoring of pt's vestibular disturbances.  No further acute PT needs at this time.    Follow Up Recommendations Outpatient PT(Vestibular)    Equipment Recommendations  None recommended by PT    Recommendations for Other Services       Precautions / Restrictions Precautions Precautions: Fall Precaution Comments: Mod Restrictions Weight Bearing Restrictions: No      Mobility  Bed Mobility Overal bed mobility: Modified Independent    General bed mobility comments: Supine<>sit, extra time and HOB elevated  Transfers Overall transfer level: Modified independent Equipment used: Rolling walker (2 wheeled)  General transfer comment: Sit<>stand with Mod I assist, rising slowly and maintains balance wihtout correction.  Ambulation/Gait Ambulation/Gait assistance: Modified independent (Device/Increase time) Ambulation Distance (Feet): 200 Feet Assistive device: Rolling walker (2 wheeled) Gait Pattern/deviations: WFL(Within Functional Limits);Step-through pattern     General Gait Details: Slow steady gait with fixed forward gaze, able to turn head L<>R wihtout balance deviation but with slight increase in symptoms.  Stairs   Wheelchair Mobility    Modified Rankin (Stroke Patients Only)     Balance Overall  balance assessment: Modified Independent;History of Falls(Pt reports sometimes when she gets vertigo she has fallen)        Pertinent Vitals/Pain Pain Assessment: No/denies pain    Home Living Family/patient expects to be discharged to:: Private residence Living Arrangements: Children(yound daughter) Available Help at Discharge: Family(brother 10 blocks [address]) Type of Home: House Home Access: Stairs to enter   Secretary/administratorntrance Stairs-Number of Steps: 2 Home Layout: One level Home Equipment: Environmental consultantWalker - 2 wheels      Prior Function Level of Independence: Independent with assistive device(s)   Comments: Amb with      Hand Dominance        Extremity/Trunk Assessment   Upper Extremity Assessment Upper Extremity Assessment: Overall WFL for tasks assessed    Lower Extremity Assessment Lower Extremity Assessment: Overall WFL for tasks assessed       Communication   Communication: No difficulties  Cognition Arousal/Alertness: Awake/alert Behavior During Therapy: WFL for tasks assessed/performed Overall Cognitive Status: Within Functional Limits for tasks assessed    General Comments: Plesant and answers all questions appropriately      General Comments General comments (skin integrity, edema, etc.): All visible areas C/DI    Exercises Other Exercises Other Exercises: VOR: Gaze stabilization, smooth pursuit, and head same direction and    Assessment/Plan    PT Assessment Patent does not need any further PT services  PT Problem List         PT Treatment Interventions      PT Goals (Current goals can be found in the Care Plan section)  Acute Rehab PT Goals Patient Stated Goal: To be less dizzy PT Goal Formulation: With patient Time For Goal Achievement: 06/17/17 Potential to Achieve Goals: Good    Frequency  Barriers to discharge  None      Co-evaluation          AM-PAC PT "6 Clicks" Daily Activity  Outcome Measure Difficulty turning over in bed  (including adjusting bedclothes, sheets and blankets)?: None Difficulty moving from lying on back to sitting on the side of the bed? : None Difficulty sitting down on and standing up from a chair with arms (e.g., wheelchair, bedside commode, etc,.)?: None Help needed moving to and from a bed to chair (including a wheelchair)?: None Help needed walking in hospital room?: None Help needed climbing 3-5 steps with a railing? : None 6 Click Score: 24    End of Session   Activity Tolerance: Patient tolerated treatment well Patient left: in bed;with call bell/phone within reach;with family/visitor present Nurse Communication: Mobility status PT Visit Diagnosis: Dizziness and giddiness (R42)    Time: 1610-9604 PT Time Calculation (min) (ACUTE ONLY): 29 min   Charges:   PT Evaluation $PT Eval Low Complexity: 1 Low PT Treatments $Therapeutic Exercise: 8-22 mins   PT G Codes:        Barnard Sharps A Nyomi Howser, PT 05/13/2017, 3:31 PM

## 2017-05-13 NOTE — Discharge Instructions (Signed)
We will up with primary care physician in 5-7 days Outpatient follow-up with neurology in 3-4 weeks Outpatient follow-up with ENT Dr. Jenne CampusMcQueen in 2 weeks Outpatient follow-up with vestibular rehabilitation center
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
## 2019-11-27 IMAGING — CT CT HEAD W/O CM
3 series · 15 of 47 positions shown, 18 images · non-contrast
Comparison: None.

CLINICAL DATA: Dizziness.  Photophobia, nausea, vomiting.

EXAM:
CT HEAD WITHOUT CONTRAST
TECHNIQUE: Contiguous axial images were obtained from the base of the skull
through the vertex without intravenous contrast.

[Series 2: head wo · axial · 0.47mm/px · z∈[-135,-10]mm · 9 of 31 slices shown, 12 images]
[im 3/31  brain]
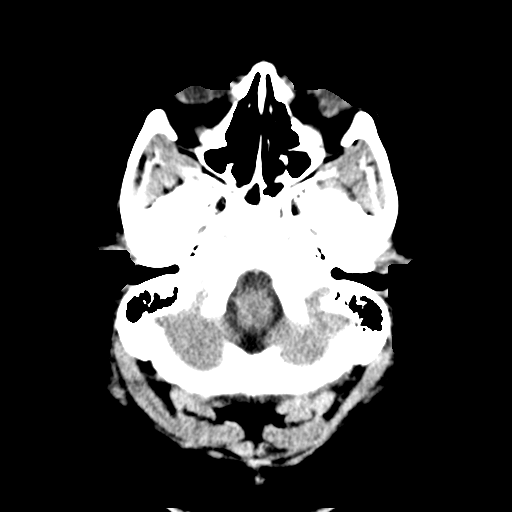
[im 3/31  bone]
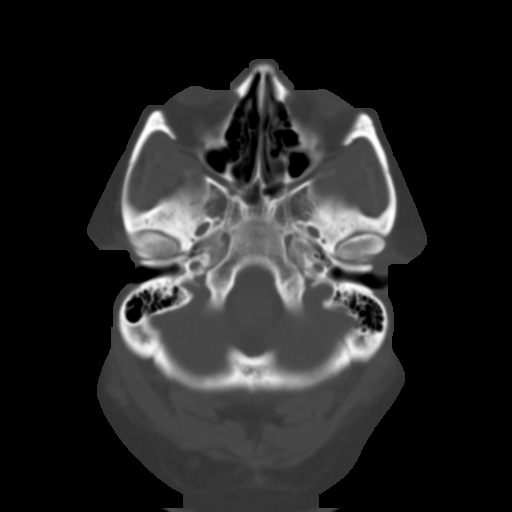
[im 6/31  brain]
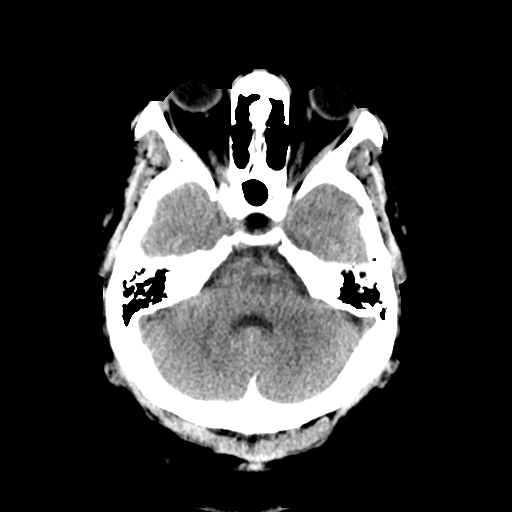
[im 9/31  brain]
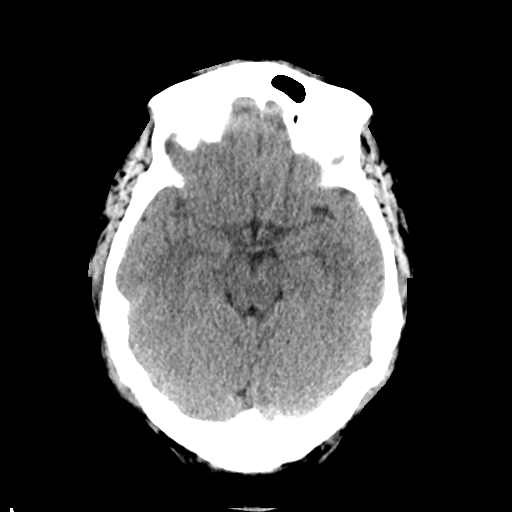
[im 12/31  brain]
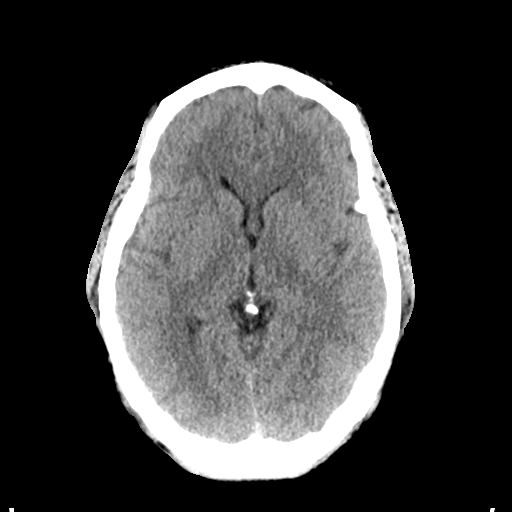
[im 16/31  brain]
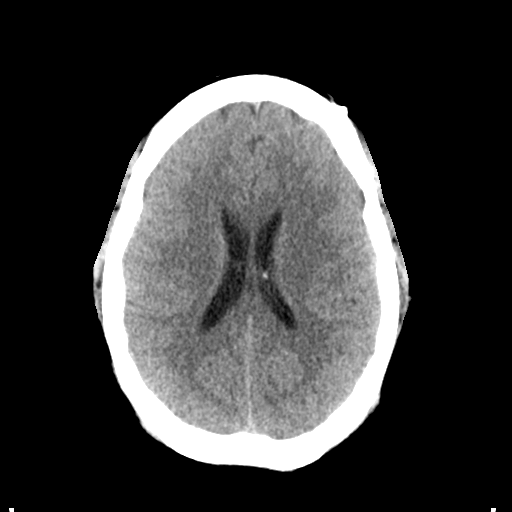
[im 16/31  bone]
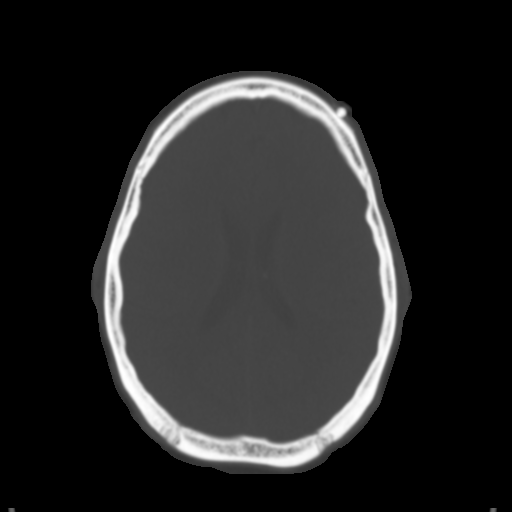
[im 19/31  brain]
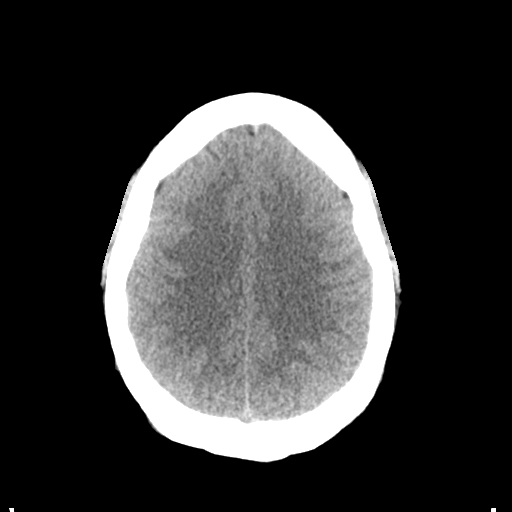
[im 22/31  brain]
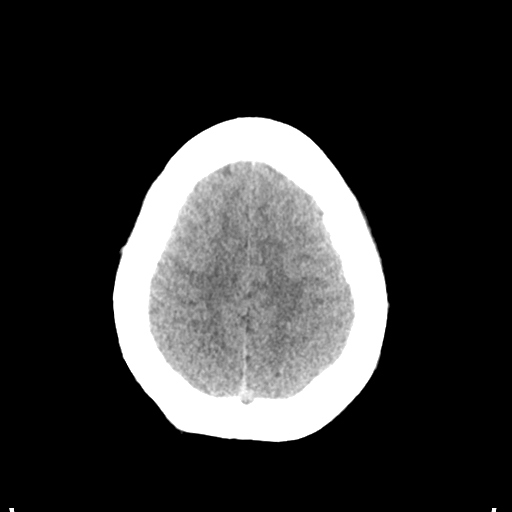
[im 25/31  brain]
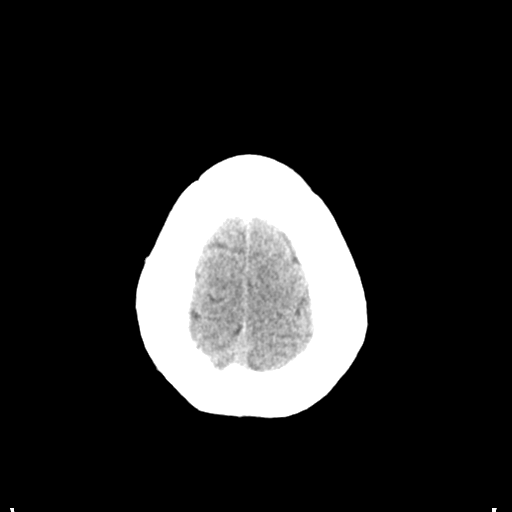
[im 28/31  brain]
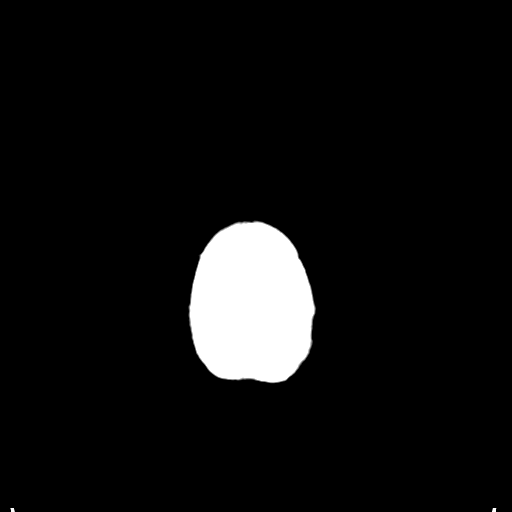
[im 28/31  bone]
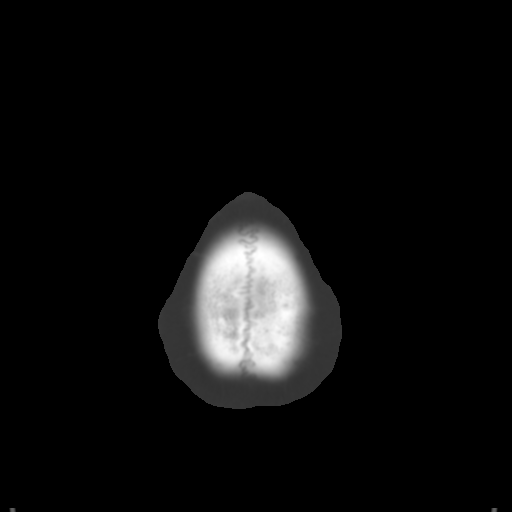

[Series 4: coronal soft tissue · coronal · 0.31mm/px · 3 of 65 slices shown]
[im 22/65  brain]
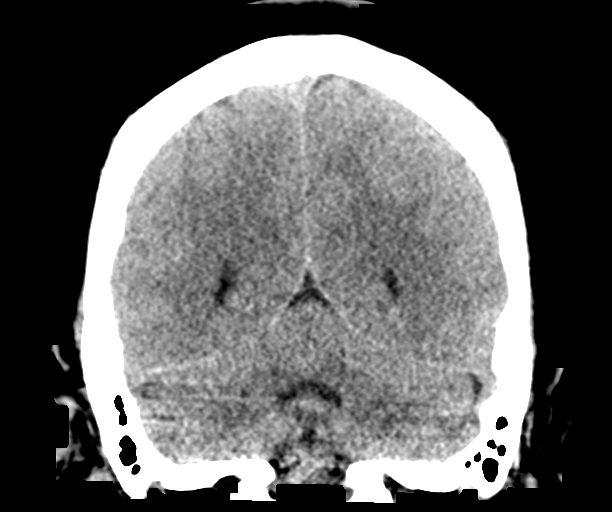
[im 29/65  brain]
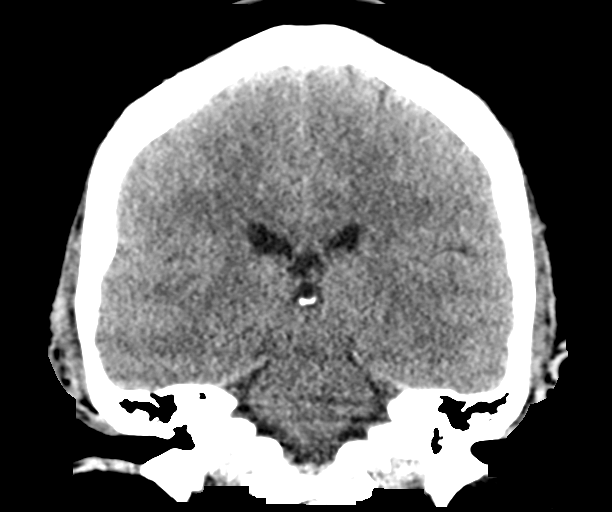
[im 36/65  brain]
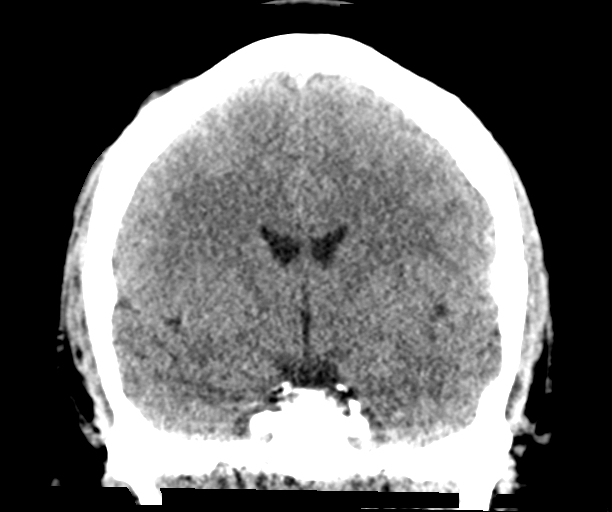

[Series 5: sagittal soft tissue · sagittal · 0.30mm/px · 3 of 55 slices shown]
[im 19/55  brain]
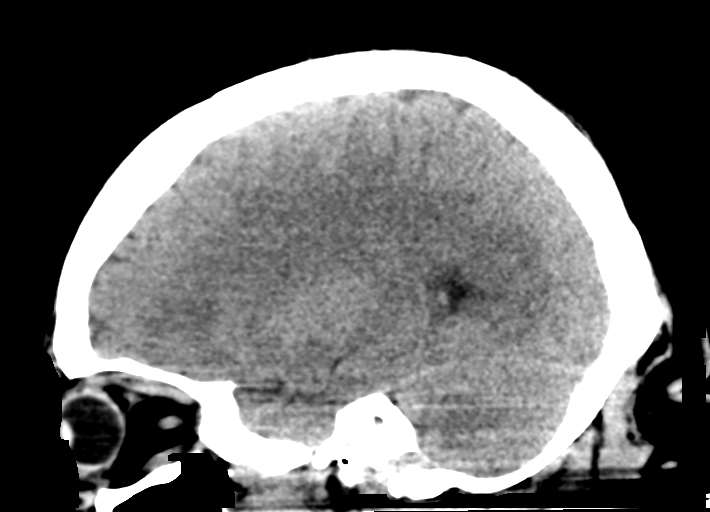
[im 28/55  brain]
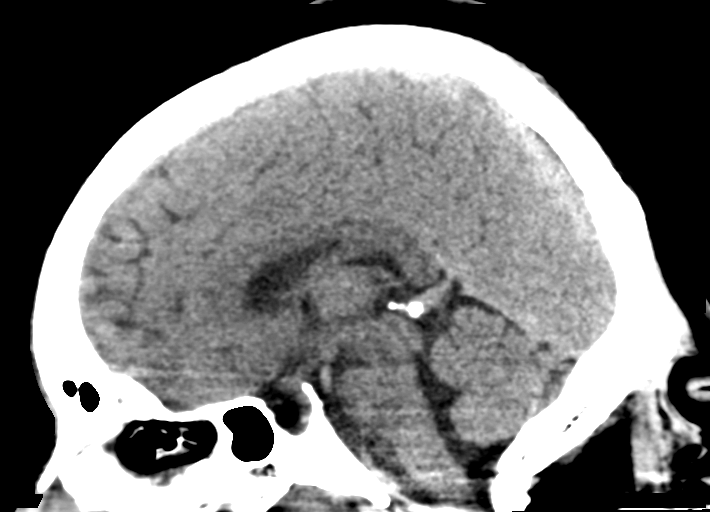
[im 37/55  brain]
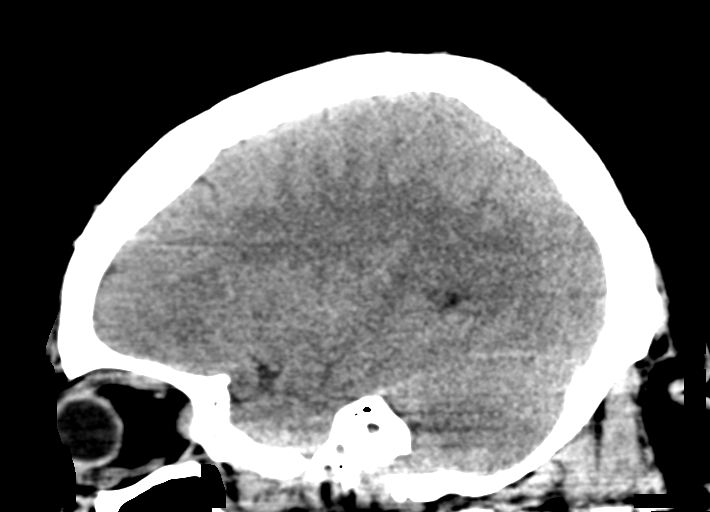

[15 of 47 positions shown; findings below may reference images not displayed]

FINDINGS: Brain: No acute intracranial abnormality. Specifically, no
hemorrhage, hydrocephalus, mass lesion, acute infarction, or
significant intracranial injury. Dural calcifications noted anterior
to the temporal lobe in the middle cranial fossa.

Vascular: No hyperdense vessel or unexpected calcification.

Skull: No acute calvarial abnormality.

Sinuses/Orbits: Visualized paranasal sinuses and mastoids clear.
Orbital soft tissues unremarkable.

Other: None
IMPRESSION: No acute intracranial abnormality.
